# Patient Record
Sex: Female | Born: 1995 | Race: White | Hispanic: No | Marital: Single | Smoking: Current some day smoker
Health system: Southern US, Community
[De-identification: ages and names within clinical notes are randomized; demographics above are authoritative.]

## PROBLEM LIST (undated history)

## (undated) DIAGNOSIS — G43909 Migraine, unspecified, not intractable, without status migrainosus: Secondary | ICD-10-CM

## (undated) DIAGNOSIS — J45909 Unspecified asthma, uncomplicated: Secondary | ICD-10-CM

## (undated) DIAGNOSIS — B019 Varicella without complication: Secondary | ICD-10-CM

## (undated) DIAGNOSIS — T7840XA Allergy, unspecified, initial encounter: Secondary | ICD-10-CM

## (undated) HISTORY — DX: Unspecified asthma, uncomplicated: J45.909

## (undated) HISTORY — PX: NO PAST SURGERIES: SHX2092

## (undated) HISTORY — DX: Varicella without complication: B01.9

## (undated) HISTORY — DX: Allergy, unspecified, initial encounter: T78.40XA

---
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
## 2013-08-08 ENCOUNTER — Emergency Department: Payer: Self-pay | Admitting: Emergency Medicine

## 2013-08-09 LAB — URINALYSIS, COMPLETE
Bilirubin,UR: NEGATIVE
Glucose,UR: NEGATIVE mg/dL (ref 0–75)
Nitrite: NEGATIVE
Protein: 100
Specific Gravity: 1.028 (ref 1.003–1.030)
WBC UR: 4 /HPF (ref 0–5)

## 2014-12-31 ENCOUNTER — Emergency Department: Payer: Self-pay | Admitting: Emergency Medicine

## 2016-12-05 ENCOUNTER — Encounter: Payer: Self-pay | Admitting: Obstetrics and Gynecology

## 2017-04-22 ENCOUNTER — Encounter: Payer: Self-pay | Admitting: *Deleted

## 2017-04-22 ENCOUNTER — Emergency Department
Admission: EM | Admit: 2017-04-22 | Discharge: 2017-04-23 | Disposition: A | Discharge status: Home / Self Care | Payer: BLUE CROSS/BLUE SHIELD | Attending: Emergency Medicine | Admitting: Emergency Medicine

## 2017-04-22 ENCOUNTER — Emergency Department: Payer: BLUE CROSS/BLUE SHIELD

## 2017-04-22 DIAGNOSIS — J40 Bronchitis, not specified as acute or chronic: Secondary | ICD-10-CM | POA: Insufficient documentation

## 2017-04-22 DIAGNOSIS — R0602 Shortness of breath: Secondary | ICD-10-CM | POA: Diagnosis present

## 2017-04-22 DIAGNOSIS — F17200 Nicotine dependence, unspecified, uncomplicated: Secondary | ICD-10-CM | POA: Insufficient documentation

## 2017-04-22 MED ORDER — ALBUTEROL SULFATE (2.5 MG/3ML) 0.083% IN NEBU
INHALATION_SOLUTION | RESPIRATORY_TRACT | Status: AC
  Filled 2017-04-22: qty 6

## 2017-04-22 MED ORDER — ALBUTEROL SULFATE (2.5 MG/3ML) 0.083% IN NEBU
5.0000 mg | INHALATION_SOLUTION | Freq: Once | RESPIRATORY_TRACT | Status: AC
  Administered 2017-04-22: 5 mg via RESPIRATORY_TRACT

## 2017-04-22 MED ORDER — PREDNISONE 20 MG PO TABS
60.0000 mg | ORAL_TABLET | Freq: Once | ORAL | Status: AC
  Administered 2017-04-22: 60 mg via ORAL
  Filled 2017-04-22: qty 3

## 2017-04-22 MED ORDER — IPRATROPIUM-ALBUTEROL 0.5-2.5 (3) MG/3ML IN SOLN
3.0000 mL | Freq: Once | RESPIRATORY_TRACT | Status: AC
  Administered 2017-04-23: 3 mL via RESPIRATORY_TRACT
  Filled 2017-04-22: qty 3

## 2017-04-22 NOTE — ED Triage Notes (Signed)
Pt reports wheezing, shortness of breath, cough, sore throat, headache, sneezing, nasal congestion x 1 weeks. c/o upper/mid back pain. Denies fevers. Pt took mucinex and theraflu today.

## 2017-04-22 NOTE — ED Provider Notes (Signed)
[facility] Emergency Department Provider Note   ____________________________________________   First MD Initiated Contact with Patient 04/22/17 2321     (approximate)  I have reviewed the triage vital signs and the nursing notes.   HISTORY  Chief Complaint Shortness of Breath    HPI Vanessa Lindsey is a 21 y.o. female who presents to the ED from home with a chief complaint of nonproductive cough, wheezing, shortness of breath, sore throat, headache, sneezing and nasal congestion. Reports symptoms for one week. Symptoms associated with chest wall and back pain. Denies associated fever, chills, abdominal pain, nausea, vomiting, diarrhea. Denies recent travel or trauma. Patient tried Mucinex and TheraFlu today without relief of symptoms.   Past medical history None  There are no active problems to display for this patient.   History reviewed. No pertinent surgical history.  Prior to Admission medications   Medication Sig Start Date End Date Taking? Authorizing Provider  albuterol (PROVENTIL HFA;VENTOLIN HFA) 108 (90 Base) MCG/ACT inhaler Inhale 2 puffs into the lungs every 4 (four) hours as needed for wheezing or shortness of breath. 04/23/17   Irean Hong, MD  predniSONE (DELTASONE) 20 MG tablet 3 tablets daily x 4 days 04/23/17   Irean Hong, MD    Allergies Sulfa antibiotics  No family history on file.  Social History Social History  Substance Use Topics  . Smoking status: Current Every Day Smoker  . Smokeless tobacco: Current User  . Alcohol use No    Review of Systems  Constitutional: No fever/chills. Eyes: No visual changes. ENT: Positive for congestion and sore throat. Cardiovascular: Positive for chest wall pain. Respiratory: Positive for dry cough, wheezing, and shortness of breath. Gastrointestinal: No abdominal pain.  No nausea, no vomiting.  No diarrhea.  No constipation. Genitourinary: Negative for  dysuria. Musculoskeletal: Positive for back pain. Skin: Negative for rash. Neurological: Negative for headaches, focal weakness or numbness.   ____________________________________________   PHYSICAL EXAM:  VITAL SIGNS: ED Triage Vitals [04/22/17 2128]  Enc Vitals Group     BP (!) 124/57     Pulse Rate (!) 105     Resp (!) 22     Temp 99 F (37.2 C)     Temp Source Oral     SpO2 98 %     Weight 185 lb (83.9 kg)     Height  (1.575 m)     Head Circumference      Peak Flow      Pain Score      Pain Loc      Pain Edu?      Excl. in GC?      Constitutional: Alert and oriented. Well appearing and in no acute distress. Eyes: Conjunctivae are normal. PERRL. EOMI. Head: Atraumatic. Nose: Congestion/rhinnorhea. Mouth/Throat: Mucous membranes are moist.  Oropharynx slightly erythematous. Neck: No stridor.  Supple neck without meningismus. Hematological/Lymphatic/Immunilogical: No cervical lymphadenopathy. Cardiovascular: Normal rate, regular rhythm. Grossly normal heart sounds.  Good peripheral circulation. Respiratory: Normal respiratory effort.  No retractions. Lungs with scattered wheezing. Gastrointestinal: Soft and nontender. No distention. No abdominal bruits. No CVA tenderness. Musculoskeletal: No lower extremity tenderness nor edema.  No joint effusions. Neurologic:  Normal speech and language. No gross focal neurologic deficits are appreciated. No gait instability. Skin:  Skin is warm, dry and intact. No rash noted. No petechiae. Psychiatric: Mood and affect are normal. Speech and behavior are normal.  ____________________________________________   LABS (all labs ordered are listed, but  only abnormal results are displayed)  Labs Reviewed - No data to display ____________________________________________  EKG  None ____________________________________________  RADIOLOGY  Chest 2 view interpreted per Dr. Gwenyth Bender: No active cardiopulmonary  disease. ____________________________________________   PROCEDURES  Procedure(s) performed: None  Procedures  Critical Care performed: No  ____________________________________________   INITIAL IMPRESSION / ASSESSMENT AND PLAN / ED COURSE  Pertinent labs & imaging results that were available during my care of the patient were reviewed by me and considered in my medical decision making (see chart for details).  21 year old female who presents with bronchitis with wheezing. Chest x-ray negative for pneumonia. Will administer DuoNeb, initiate prednisone and reassess. Low suspicion for ACS, PE, dissection.  Clinical Course as of Apr 24 131  Tue Apr 23, 2017  0100 Reexamined after DuoNeb and prednisone: No wheezing. Room air saturations 98%. Patient feels significantly better. Will discharge home with prescriptions for prednisone, albuterol inhaler and Tussionex as needed. Strict return precautions given. Patient verbalizes understanding and agrees with plan of care.  [JS]    Clinical Course User Index [JS] Irean Hong, MD     ____________________________________________   FINAL CLINICAL IMPRESSION(S) / ED DIAGNOSES  Final diagnoses:  Bronchitis      NEW MEDICATIONS STARTED DURING THIS VISIT:  New Prescriptions   ALBUTEROL (PROVENTIL HFA;VENTOLIN HFA) 108 (90 BASE) MCG/ACT INHALER    Inhale 2 puffs into the lungs every 4 (four) hours as needed for wheezing or shortness of breath.   PREDNISONE (DELTASONE) 20 MG TABLET    3 tablets daily x 4 days     Note:  This document was prepared using Dragon voice recognition software and may include unintentional dictation errors.    Irean Hong, MD 04/23/17 [phone]

## 2017-04-23 MED ORDER — HYDROCOD POLST-CPM POLST ER 10-8 MG/5ML PO SUER: 5.0000 mL | Freq: Two times a day (BID) | ORAL | 0 refills | Status: DC

## 2017-04-23 MED ORDER — PREDNISONE 20 MG PO TABS: ORAL_TABLET | ORAL | 0 refills | Status: DC

## 2017-04-23 MED ORDER — ALBUTEROL SULFATE HFA 108 (90 BASE) MCG/ACT IN AERS: 2.0000 | INHALATION_SPRAY | RESPIRATORY_TRACT | 0 refills | Status: DC | PRN

## 2017-04-23 NOTE — Discharge Instructions (Addendum)
1. Finish steroid as prescribed (prednisone 60 mg daily 4 days). 2. Use albuterol inhaler 2 puffs every 4 hours as needed for wheezing. 3. Take cough medicine as needed (Tussionex). 4. Return to the ER for worsening symptoms, persistent vomiting, difficulty breathing or other concerns.

## 2017-09-05 ENCOUNTER — Encounter (INDEPENDENT_AMBULATORY_CARE_PROVIDER_SITE_OTHER): Payer: Self-pay

## 2017-09-05 ENCOUNTER — Other Ambulatory Visit (INDEPENDENT_AMBULATORY_CARE_PROVIDER_SITE_OTHER): Payer: BLUE CROSS/BLUE SHIELD

## 2017-09-05 ENCOUNTER — Encounter: Payer: Self-pay | Admitting: Primary Care

## 2017-09-05 ENCOUNTER — Ambulatory Visit (INDEPENDENT_AMBULATORY_CARE_PROVIDER_SITE_OTHER): Payer: BLUE CROSS/BLUE SHIELD | Admitting: Primary Care

## 2017-09-05 VITALS — BP 122/80 | HR 82 | Temp 98.1°F | Ht 61.0 in | Wt 187.8 lb

## 2017-09-05 DIAGNOSIS — N926 Irregular menstruation, unspecified: Secondary | ICD-10-CM

## 2017-09-05 DIAGNOSIS — E669 Obesity, unspecified: Secondary | ICD-10-CM

## 2017-09-05 LAB — COMPREHENSIVE METABOLIC PANEL
ALT: 7 U/L (ref 0–35)
AST: 10 U/L (ref 0–37)
Albumin: 4.3 g/dL (ref 3.5–5.2)
Alkaline Phosphatase: 88 U/L (ref 39–117)
BUN: 12 mg/dL (ref 6–23)
CALCIUM: 9.3 mg/dL (ref 8.4–10.5)
CHLORIDE: 107 meq/L (ref 96–112)
CO2: 26 meq/L (ref 19–32)
Creatinine, Ser: 0.71 mg/dL (ref 0.40–1.20)
GFR: 107.85 mL/min (ref >60.00)
Glucose, Bld: 96 mg/dL (ref 70–99)
Potassium: 4.2 mEq/L (ref 3.5–5.1)
Sodium: 139 mEq/L (ref 135–145)
Total Bilirubin: 0.6 mg/dL (ref 0.2–1.2)
Total Protein: 7 g/dL (ref 6.0–8.3)

## 2017-09-05 LAB — LIPID PANEL
CHOL/HDL RATIO: 3
Cholesterol: 100 mg/dL (ref 0–200)
HDL: 30.2 mg/dL — ABNORMAL LOW (ref >39.00)
LDL CALC: 51 mg/dL (ref 0–99)
NonHDL: 70.28
TRIGLYCERIDES: 96 mg/dL (ref 0.0–149.0)
VLDL: 19.2 mg/dL (ref 0.0–40.0)

## 2017-09-05 LAB — HEMOGLOBIN A1C: Hgb A1c MFr Bld: 5.5 % (ref 4.6–6.5)

## 2017-09-05 LAB — TSH: TSH: 1.61 u[IU]/mL (ref 0.35–4.50)

## 2017-09-05 MED ORDER — ETONOGESTREL-ETHINYL ESTRADIOL 0.12-0.015 MG/24HR VA RING: VAGINAL_RING | VAGINAL | 3 refills | Status: DC

## 2017-09-05 NOTE — Assessment & Plan Note (Signed)
Long discussion today about her diet and importance of regular exercise. Discussed BMI goal of less than 25.

## 2017-09-05 NOTE — Patient Instructions (Signed)
Complete lab work prior to leaving today. I will notify you of your results once received.   Start [employer]. Insert on the Sunday after your menstrual cycle begins.   For example, if you start your period on Monday, then insert the Nuva Ring that following Sunday. If you start your period on Sunday, then insert your Nuva Ring that same day.  Pull out the Nuva Ring at the end of the third week to allow for a menstrual cycle.   It may take three to six months for regulation of your cycle.   It was a pleasure to meet you today! Please don't hesitate to call me with any questions. Welcome to [facility]!

## 2017-09-05 NOTE — Assessment & Plan Note (Addendum)
Could be secondary to PCOS. We'll check A1c, CMP, TSH today. Strongly recommended weight loss through healthy diet and regular exercise. Discussed diet today, she seems to be on the right track.  Discussed different options for birth control, she would like to try NuvaRing. Discussed risk of smoking and birth control, she is working to cut back and will try over-the-counter products. Urine pregnancy negative today. Discussed start date of NuvaRing and directions for insertion. Discussed that it may take 3-6 months for periods to regulate, she verbalized understanding.

## 2017-09-05 NOTE — Progress Notes (Signed)
Subjective:    Patient ID: Vanessa Lindsey, female    DOB: [birth year], 21 y.o.   MRN: [id number]  HPI  Vanessa Lindsey is a 21 year old female who presents today to establish care and discuss the problems mentioned below. Will obtain old records.  1) Menstrual Problem: Menarche at age 21. She experienced regular periods until mid high school then started to notice cycles with spotting, missing periods, irregular periods, menorrhagia. Her cycles did regulate for a while but over the past 6 months she's noticed irregularity with spotting, long lasting periods, and then sometimes will skip periods. LMP was 08/19/17.  She has a family history of PCOS in her mother. She's also noticed dark hair growth to her buttocks, neck line.   2) Obesity: She's working on weight loss through diet changes and exercise.   Diet currently consists of:  Breakfast: None Lunch: Protein, vegetables, starch Dinner: Protein, vegetable, starch Snacks: Fruit, granola bar Desserts: Occasionally Beverages: Sweet tea, water, little soda and juice  Exercise: She is not currently exercising   3) Asthma: Overall mild. Typically has flares during seasonal changes. She has an albuterol inhaler for which she uses during seasonal changes. She'll also take benadryl PRN.   Review of Systems  Constitutional: Negative for fatigue.  Respiratory: Negative for shortness of breath.   Cardiovascular: Negative for chest pain.  Genitourinary: Positive for menstrual problem.  Skin:       Dark hair growth to buttocks, extremities, neck  Allergic/Immunologic: Positive for environmental allergies.       Past Medical History:  Diagnosis Date  . Allergy   . Asthma   . Chickenpox      Social History   Social History  . Marital status: Single    Spouse name: N/A  . Number of children: N/A  . Years of education: N/A   Occupational History  . Not on file.   Social History Main Topics  . Smoking status: Current Every  Day Smoker  . Smokeless tobacco: Never Used  . Alcohol use Yes  . Drug use: No  . Sexual activity: Not on file   Other Topics Concern  . Not on file   Social History Narrative   Single.   No children.   Works at [employer].   Enjoys shopping, spending time with friends.    No past surgical history on file.  Family History  Problem Relation Age of Onset  . Hypothyroidism Mother   . Heart attack Father   . Diabetes Father   . Hyperlipidemia Father   . Cancer Maternal Grandmother        Unsure    Allergies  Allergen Reactions  . Sulfa Antibiotics Hives    No current outpatient prescriptions on file prior to visit.   No current facility-administered medications on file prior to visit.     BP 122/80   Pulse 82   Temp 98.1 F (36.7 C) (Oral)   Ht 5\' 1"  (1.549 m)   Wt 187 lb 12.8 oz (85.2 kg)   LMP 08/25/2017 (Within Days)   SpO2 97%   BMI 35.48 kg/m    Objective:   Physical Exam  Constitutional: She appears well-nourished.  Neck: Neck supple.  Cardiovascular: Normal rate and regular rhythm.   Pulmonary/Chest: Effort normal and breath sounds normal.  Skin: Skin is warm and dry.  No hair growth noted to neck line    Psychiatric: She has a normal mood and affect.  Assessment & Plan:

## 2018-12-31 NOTE — L&D Delivery Note (Signed)
Delivery Note At 1520  a viable and healthy female "Birdie Riddle" was delivered via  (Presentation: OA ;  ).  APGAR: 8,9 ; weight pending skin to skin  .   Placenta status: battledore delivered intact with 3 vessel  Cord:  with the following complications: [place] x1 reduced on perineum  Anesthesia:  none Episiotomy:  none Lacerations:  none Suture Repair: NA Est. Blood Loss (mL):  150  Mom to postpartum.  Baby to Couplet care / Skin to Skin.  Ninel Abdella N Cynde Menard 10/13/2019, 3:35 PM

## 2019-04-27 ENCOUNTER — Ambulatory Visit (INDEPENDENT_AMBULATORY_CARE_PROVIDER_SITE_OTHER): Payer: BLUE CROSS/BLUE SHIELD | Admitting: Certified Nurse Midwife

## 2019-04-27 ENCOUNTER — Other Ambulatory Visit: Payer: Self-pay

## 2019-04-27 ENCOUNTER — Other Ambulatory Visit (HOSPITAL_COMMUNITY)
Admission: RE | Admit: 2019-04-27 | Discharge: 2019-04-27 | Disposition: A | Discharge status: Home / Self Care | Payer: BLUE CROSS/BLUE SHIELD | Source: Ambulatory Visit | Attending: Certified Nurse Midwife | Admitting: Certified Nurse Midwife

## 2019-04-27 ENCOUNTER — Encounter: Payer: Self-pay | Admitting: Certified Nurse Midwife

## 2019-04-27 VITALS — BP 103/74 | HR 101 | Wt 209.5 lb

## 2019-04-27 DIAGNOSIS — Z3402 Encounter for supervision of normal first pregnancy, second trimester: Secondary | ICD-10-CM | POA: Insufficient documentation

## 2019-04-27 LAB — OB RESULTS CONSOLE VARICELLA ZOSTER ANTIBODY, IGG: Varicella: IMMUNE

## 2019-04-27 MED ORDER — VITAFOL GUMMIES 3.33-0.333-34.8 MG PO CHEW: 1.0000 | CHEWABLE_TABLET | Freq: Every day | ORAL | 4 refills | Status: DC

## 2019-04-27 NOTE — Progress Notes (Signed)
NEW OB HISTORY AND PHYSICAL  SUBJECTIVE:       Vanessa Lindsey is a 23 y.o. No obstetric history on file. female, Patient's last menstrual period was 01/13/2019 (exact date)., Estimated Date of Delivery:10/20/19 presents today for establishment of Prenatal Care. She has no unusual complaints.    Gynecologic History Patient's last menstrual period was 01/13/2019 (exact date). Normal Contraception: none Last Pap: 2016. Results were: normal  Obstetric History OB History  Gravida Para Term Preterm AB Living  1            SAB TAB Ectopic Multiple Live Births               # Outcome Date GA Lbr Len/2nd Weight Sex Delivery Anes PTL Lv  1 Current             Past Medical History:  Diagnosis Date  . Allergy   . Asthma   . Chickenpox     History reviewed. No pertinent surgical history.  Current Outpatient Medications on File Prior to Visit  Medication Sig Dispense Refill  . albuterol (PROVENTIL HFA;VENTOLIN HFA) 108 (90 Base) MCG/ACT inhaler Inhale into the lungs every 6 (six) hours as needed.    . Prenatal Vit-Fe Fumarate-FA (PRENATAL MULTIVITAMIN) TABS tablet Take 1 tablet by mouth daily at 12 noon.     No current facility-administered medications on file prior to visit.     Allergies  Allergen Reactions  . Sulfa Antibiotics Hives    Social History   Socioeconomic History  . Marital status: Single    Spouse name: Not on file  . Number of children: Not on file  . Years of education: Not on file  . Highest education level: Not on file  Occupational History  . Not on file  Social Needs  . Financial resource strain: Not on file  . Food insecurity:    Worry: Not on file    Inability: Not on file  . Transportation needs:    Medical: Not on file    Non-medical: Not on file  Tobacco Use  . Smoking status: Current Every Day Smoker  . Smokeless tobacco: Never Used  Substance and Sexual Activity  . Alcohol use: Not Currently  . Drug use: No  . Sexual activity: Yes     Birth control/protection: None  Lifestyle  . Physical activity:    Days per week: Not on file    Minutes per session: Not on file  . Stress: Not on file  Relationships  . Social connections:    Talks on phone: Not on file    Gets together: Not on file    Attends religious service: Not on file    Active member of club or organization: Not on file    Attends meetings of clubs or organizations: Not on file    Relationship status: Not on file  . Intimate partner violence:    Fear of current or ex partner: Not on file    Emotionally abused: Not on file    Physically abused: Not on file    Forced sexual activity: Not on file  Other Topics Concern  . Not on file  Social History Narrative   Single.   No children.   Works at [employer].   Enjoys shopping, spending time with friends.    Family History  Problem Relation Age of Onset  . Hypothyroidism Mother   . Heart attack Father   . Diabetes Father   . Hyperlipidemia Father   .  Cancer Maternal Grandmother        Unsure    The following portions of the patient's history were reviewed and updated as appropriate: allergies, current medications, past OB history, past medical history, past surgical history, past family history, past social history, and problem list.    OBJECTIVE: Initial Physical Exam (New OB)  GENERAL APPEARANCE: alert, well appearing, in no apparent distress, oriented to person, place and time, obese HEAD: normocephalic, atraumatic MOUTH: mucous membranes moist, pharynx normal without lesions THYROID: no thyromegaly or masses present BREASTS: no masses noted, no significant tenderness, no palpable axillary nodes, no skin changes LUNGS: clear to auscultation, no wheezes, rales or rhonchi, symmetric air entry HEART: regular rate and rhythm, no murmurs ABDOMEN: soft, nontender, nondistended, no abnormal masses, no epigastric pain, obese, fundus not palpable and fundus soft, nontender 154 cm EXTREMITIES: no  redness or tenderness in the calves or thighs, no edema, no limitation in range of motion, intact peripheral pulses SKIN: normal coloration and turgor, no rashes LYMPH NODES: no adenopathy palpable NEUROLOGIC: alert, oriented, normal speech, no focal findings or movement disorder noted  PELVIC EXAM EXTERNAL GENITALIA: normal appearing vulva with no masses, tenderness or lesions VAGINA: no abnormal discharge or lesions CERVIX: no lesions or cervical motion tenderness UTERUS: gravid ADNEXA: no masses palpable and nontender OB EXAM PELVIMETRY: appears adequate RECTUM: exam not indicated  ASSESSMENT: Normal pregnancy  PLAN: New OB counseling: The patient has been given an overview regarding routine prenatal care. MD or midwifery care. Pt request to see midwives. Recommendations regarding diet, weight gain, and exercise in pregnancy were given. Early glucose screen ordered @ 16 wks. Prenatal testing, optional genetic testing,carrier screening, and ultrasound use in pregnancy were reviewed.  Benefits of Breast Feeding were discussed. The patient is encouraged to consider nursing her baby post partum. Return for glucose labs 2 wks. ROB 6 wks for anatomy scan and rob with Melody .   Doreene BurkeAnnie Nalini Alcaraz, CNM

## 2019-04-27 NOTE — Patient Instructions (Signed)

## 2019-04-27 NOTE — Progress Notes (Signed)
Vanessa Lindsey presents for NOB physical. Pregnancy confirmation done 02/13/19 [facility]. LMP 01/13/19.  G1 .  P0   . Pregnancy education material explained and given. 0 cats in the home. NOB labs ordered. (TSH/HbgA1c due to Increased BMI. HIV labs and Drug screen were explained optional and she did not decline. Drug screen ordered. PNV encouraged. Genetic screening options discussed. Genetic testing: Ordered.  Pt may discuss with provider.   All questions answered.

## 2019-04-28 LAB — HEPATITIS B SURFACE ANTIGEN: Hepatitis B Surface Ag: NEGATIVE

## 2019-04-28 LAB — MONITOR DRUG PROFILE 14(MW)
Amphetamine Scrn, Ur: NEGATIVE ng/mL
BARBITURATE SCREEN URINE: NEGATIVE ng/mL
BENZODIAZEPINE SCREEN, URINE: NEGATIVE ng/mL
Buprenorphine, Urine: NEGATIVE ng/mL
CANNABINOIDS UR QL SCN: NEGATIVE ng/mL
Cocaine (Metab) Scrn, Ur: NEGATIVE ng/mL
Creatinine(Crt), U: 74.3 mg/dL (ref 20.0–300.0)
Fentanyl, Urine: NEGATIVE pg/mL
Meperidine Screen, Urine: NEGATIVE ng/mL
Methadone Screen, Urine: NEGATIVE ng/mL
OXYCODONE+OXYMORPHONE UR QL SCN: NEGATIVE ng/mL
Opiate Scrn, Ur: NEGATIVE ng/mL
Ph of Urine: 5.5 (ref 4.5–8.9)
Phencyclidine Qn, Ur: NEGATIVE ng/mL
Propoxyphene Scrn, Ur: NEGATIVE ng/mL
SPECIFIC GRAVITY: 1.012
Tramadol Screen, Urine: NEGATIVE ng/mL

## 2019-04-28 LAB — CBC WITH DIFFERENTIAL
Basophils Absolute: 0.1 10*3/uL (ref 0.0–0.2)
Basos: 1 %
EOS (ABSOLUTE): 0.2 10*3/uL (ref 0.0–0.4)
Eos: 1 %
Hematocrit: 35 % (ref 34.0–46.6)
Hemoglobin: 12.1 g/dL (ref 11.1–15.9)
Immature Grans (Abs): 0 10*3/uL (ref 0.0–0.1)
Immature Granulocytes: 0 %
Lymphocytes Absolute: 2.7 10*3/uL (ref 0.7–3.1)
Lymphs: 19 %
MCH: 28.7 pg (ref 26.6–33.0)
MCHC: 34.6 g/dL (ref 31.5–35.7)
MCV: 83 fL (ref 79–97)
Monocytes Absolute: 0.7 10*3/uL (ref 0.1–0.9)
Monocytes: 5 %
Neutrophils Absolute: 10.8 10*3/uL — ABNORMAL HIGH (ref 1.4–7.0)
Neutrophils: 74 %
RBC: 4.22 x10E6/uL (ref 3.77–5.28)
RDW: 13.3 % (ref 11.7–15.4)
WBC: 14.5 10*3/uL — ABNORMAL HIGH (ref 3.4–10.8)

## 2019-04-28 LAB — URINALYSIS, ROUTINE W REFLEX MICROSCOPIC
Bilirubin, UA: NEGATIVE
Glucose, UA: NEGATIVE
Ketones, UA: NEGATIVE
Leukocytes,UA: NEGATIVE
Nitrite, UA: NEGATIVE
Protein,UA: NEGATIVE
RBC, UA: NEGATIVE
Specific Gravity, UA: 1.012 (ref 1.005–1.030)
Urobilinogen, Ur: 0.2 mg/dL (ref 0.2–1.0)
pH, UA: 5.5 (ref 5.0–7.5)

## 2019-04-28 LAB — HEMOGLOBIN A1C
Est. average glucose Bld gHb Est-mCnc: 111 mg/dL
Hgb A1c MFr Bld: 5.5 % (ref 4.8–5.6)

## 2019-04-28 LAB — ABO AND RH: Rh Factor: POSITIVE

## 2019-04-28 LAB — VARICELLA ZOSTER ANTIBODY, IGG: Varicella zoster IgG: 1026 index (ref >165)

## 2019-04-28 LAB — ANTIBODY SCREEN: Antibody Screen: NEGATIVE

## 2019-04-28 LAB — RUBELLA SCREEN: Rubella Antibodies, IGG: 1.44 index (ref >0.99)

## 2019-04-28 LAB — HIV ANTIBODY (ROUTINE TESTING W REFLEX): HIV Screen 4th Generation wRfx: NONREACTIVE

## 2019-04-28 LAB — TSH: TSH: 2.45 u[IU]/mL (ref 0.450–4.500)

## 2019-04-28 LAB — RPR: RPR Ser Ql: NONREACTIVE

## 2019-04-29 ENCOUNTER — Telehealth: Payer: Self-pay

## 2019-04-29 LAB — GC/CHLAMYDIA PROBE AMP
Chlamydia trachomatis, NAA: NEGATIVE
Neisseria Gonorrhoeae by PCR: NEGATIVE

## 2019-04-29 LAB — CYTOLOGY - PAP: Diagnosis: NEGATIVE

## 2019-04-29 LAB — URINE CULTURE: Organism ID, Bacteria: NO GROWTH

## 2019-04-29 NOTE — Telephone Encounter (Signed)
Message left on voicemail for pt to return my call- neg pap results per AT.

## 2019-05-04 ENCOUNTER — Telehealth: Payer: Self-pay

## 2019-05-04 NOTE — Telephone Encounter (Signed)
Message left for pt to return call re: neg pap per AT.

## 2019-05-06 ENCOUNTER — Telehealth: Payer: Self-pay

## 2019-05-06 NOTE — Telephone Encounter (Signed)
Another voicemail message sent asking pt to return my call re: normal pap smear.

## 2019-05-11 ENCOUNTER — Other Ambulatory Visit: Payer: BLUE CROSS/BLUE SHIELD

## 2019-05-11 ENCOUNTER — Other Ambulatory Visit: Payer: Self-pay

## 2019-05-11 DIAGNOSIS — Z3402 Encounter for supervision of normal first pregnancy, second trimester: Secondary | ICD-10-CM

## 2019-05-12 LAB — GLUCOSE TOLERANCE, 1 HOUR: Glucose, 1Hr PP: 179 mg/dL (ref 65–199)

## 2019-05-13 ENCOUNTER — Other Ambulatory Visit: Payer: Self-pay | Admitting: Certified Nurse Midwife

## 2019-05-13 ENCOUNTER — Telehealth: Payer: Self-pay | Admitting: *Deleted

## 2019-05-13 ENCOUNTER — Telehealth: Payer: Self-pay | Admitting: Certified Nurse Midwife

## 2019-05-13 DIAGNOSIS — O99810 Abnormal glucose complicating pregnancy: Secondary | ICD-10-CM

## 2019-05-13 NOTE — Progress Notes (Signed)
Abnormal early glucose screen. 3 hr screen ordered. PT notified via my chart.   Doreene Burke, CNM

## 2019-05-13 NOTE — Telephone Encounter (Signed)
Coronavirus (COVID-19) Are you at risk?  Are you at risk for the Coronavirus (COVID-19)?  To be considered HIGH RISK for Coronavirus (COVID-19), you have to meet the following criteria:  . Traveled to [place], [place], [place], [place] or [place]; or in the [place] to [place], [place], [place], or [place]; and have fever, cough, and shortness of breath within the last 2 weeks of travel OR . Been in close contact with a person diagnosed with COVID-19 within the last 2 weeks and have fever, cough, and shortness of breath . IF YOU DO NOT MEET THESE CRITERIA, YOU ARE CONSIDERED LOW RISK FOR COVID-19.  What to do if you are HIGH RISK for COVID-19?  . If you are having a medical emergency, call 911. . Seek medical care right away. Before you go to a doctor's office, urgent care or emergency department, call ahead and tell them about your recent travel, contact with someone diagnosed with COVID-19, and your symptoms. You should receive instructions from your physician's office regarding next steps of care.  . When you arrive at healthcare provider, tell the healthcare staff immediately you have returned from visiting [place], [place], [place], [place] or [place]; or traveled in the [place] to [place], [place], [place], or [place]; in the last two weeks or you have been in close contact with a person diagnosed with COVID-19 in the last 2 weeks.   . Tell the health care staff about your symptoms: fever, cough and shortness of breath. . After you have been seen by a medical provider, you will be either: o Tested for (COVID-19) and discharged home on quarantine except to seek medical care if symptoms worsen, and asked to  - Stay home and avoid contact with others until you get your results (4-5 days)  - Avoid travel on public transportation if possible (such as bus, train, or airplane) or o Sent to the Emergency Department by EMS for evaluation, COVID-19 testing, and possible  admission depending on your condition and test results.  What to do if you are LOW RISK for COVID-19?  Reduce your risk of any infection by using the same precautions used for avoiding the common cold or flu:  . Wash your hands often with soap and warm water for at least 20 seconds.  If soap and water are not readily available, use an alcohol-based hand sanitizer with at least 60% alcohol.  . If coughing or sneezing, cover your mouth and nose by coughing or sneezing into the elbow areas of your shirt or coat, into a tissue or into your sleeve (not your hands). . Avoid shaking hands with others and consider head nods or verbal greetings only. . Avoid touching your eyes, nose, or mouth with unwashed hands.  . Avoid close contact with people who are sick. . Avoid places or events with large numbers of people in one location, like concerts or sporting events. . Carefully consider travel plans you have or are making. . If you are planning any travel outside or inside the [place], visit the CDC's Travelers' Health webpage for the latest health notices. . If you have some symptoms but not all symptoms, continue to monitor at home and seek medical attention if your symptoms worsen. . If you are having a medical emergency, call 911.   ADDITIONAL HEALTHCARE OPTIONS FOR PATIENTS  [facility] / e-Visit: [email or web address]         [facility]: [phone]  [facility]: [phone]                   [facility]: [phone]   Spoke with pt denies any sx.   , CMA 

## 2019-05-13 NOTE — Telephone Encounter (Signed)
The patient called and stated that she needs to speak with a nurse to ask a few questions about her glucose screening results. Please advise.

## 2019-05-13 NOTE — Telephone Encounter (Signed)
Spoke with pt we discussed her 3 hr GTT

## 2019-05-14 ENCOUNTER — Other Ambulatory Visit: Payer: Self-pay

## 2019-05-14 ENCOUNTER — Other Ambulatory Visit: Payer: BLUE CROSS/BLUE SHIELD

## 2019-05-14 DIAGNOSIS — O99810 Abnormal glucose complicating pregnancy: Secondary | ICD-10-CM

## 2019-05-15 LAB — GESTATIONAL GLUCOSE TOLERANCE
Glucose, Fasting: 86 mg/dL (ref 65–94)
Glucose, GTT - 1 Hour: 152 mg/dL (ref 65–179)
Glucose, GTT - 2 Hour: 114 mg/dL (ref 65–154)
Glucose, GTT - 3 Hour: 68 mg/dL (ref 65–139)

## 2019-06-09 ENCOUNTER — Encounter: Payer: BLUE CROSS/BLUE SHIELD | Admitting: Obstetrics and Gynecology

## 2019-06-09 ENCOUNTER — Ambulatory Visit (INDEPENDENT_AMBULATORY_CARE_PROVIDER_SITE_OTHER): Payer: BC Managed Care – PPO | Admitting: Certified Nurse Midwife

## 2019-06-09 ENCOUNTER — Other Ambulatory Visit: Payer: Self-pay

## 2019-06-09 ENCOUNTER — Ambulatory Visit (INDEPENDENT_AMBULATORY_CARE_PROVIDER_SITE_OTHER): Payer: BC Managed Care – PPO

## 2019-06-09 VITALS — BP 100/69 | HR 103 | Wt 212.7 lb

## 2019-06-09 DIAGNOSIS — Z3A21 21 weeks gestation of pregnancy: Secondary | ICD-10-CM

## 2019-06-09 DIAGNOSIS — Z3689 Encounter for other specified antenatal screening: Secondary | ICD-10-CM

## 2019-06-09 DIAGNOSIS — Z3402 Encounter for supervision of normal first pregnancy, second trimester: Secondary | ICD-10-CM

## 2019-06-09 DIAGNOSIS — Z3492 Encounter for supervision of normal pregnancy, unspecified, second trimester: Secondary | ICD-10-CM

## 2019-06-09 DIAGNOSIS — Z363 Encounter for antenatal screening for malformations: Secondary | ICD-10-CM | POA: Diagnosis not present

## 2019-06-09 LAB — POCT URINALYSIS DIPSTICK OB
Bilirubin, UA: NEGATIVE
Blood, UA: NEGATIVE
Glucose, UA: NEGATIVE
Ketones, UA: NEGATIVE
Leukocytes, UA: NEGATIVE
Nitrite, UA: NEGATIVE
POC,PROTEIN,UA: NEGATIVE
Spec Grav, UA: 1.015 (ref 1.010–1.025)
Urobilinogen, UA: 0.2 E.U./dL
pH, UA: 6 (ref 5.0–8.0)

## 2019-06-09 MED ORDER — AMOXICILLIN 500 MG PO CAPS: 500.0000 mg | ORAL_CAPSULE | Freq: Three times a day (TID) | ORAL | 0 refills | Status: DC

## 2019-06-09 MED ORDER — TERCONAZOLE 0.4 % VA CREA: 1.0000 | TOPICAL_CREAM | Freq: Every day | VAGINAL | 0 refills | Status: DC

## 2019-06-09 NOTE — Progress Notes (Signed)
ROB-Reports right ear pain x 1 week. Tympanic membrane red and bulging upon inspection. Rx: Amoxicillin and Terazole, see orders.  Anatomy scan today incomplete, see below. Discussed weight gain in pregnancy and encouraged twice daily walking. Anticipatory guidance regarding course of prenatal care. Has already contacted [facility]. Reviewed red flag symptoms and when to call. RTC x 3-4 weeks for follow up anatomy scan and ROB or sooner if needed.    ULTRASOUND REPORT  Location: [facility] Date of Service: 06/09/2019   Indications:Anatomy Ultrasound Findings:  Singleton intrauterine pregnancy is visualized with FHR at 152 BPM. Biometrics give an (U/S) Gestational age of [redacted]w[redacted]d and an (U/S) EDD of 10/22/2019; this correlates with the clinically established Estimated Date of Delivery: 10/20/19  Fetal presentation is Cephalic.  EFW: 373 g ( 13 oz ). Placenta: posterior. Grade: 1 AFI: subjectively normal.  Anatomic survey is incomplete for nose/lips , 4 ch heart and normal; Gender - female.    Right Ovary is normal in appearance. Left Ovary is normal appearance. Survey of the adnexa demonstrates no adnexal masses. There is no free peritoneal fluid in the cul de sac.  Impression: 1. [redacted]w[redacted]d Viable Singleton Intrauterine pregnancy by U/S. 2. (U/S) EDD is consistent with Clinically established Estimated Date of Delivery: 10/20/19 . 3. Anatomy Scan incomplete for nose/lips , 4 ch heart.  Recommendations: 1.Clinical correlation with the patient's History and Physical Exam.

## 2019-06-09 NOTE — Progress Notes (Signed)
ROB- anatomy scan done today, pt is having pain R ear x 1 week

## 2019-06-09 NOTE — Patient Instructions (Addendum)
Back Pain in Pregnancy Back pain during pregnancy is common. Back pain may be caused by several factors that are related to changes during your pregnancy. Follow these instructions at home: Managing pain, stiffness, and swelling      If directed, for sudden (acute) back pain, put ice on the painful area. ? Put ice in a plastic bag. ? Place a towel between your skin and the bag. ? Leave the ice on for 20 minutes, 2-3 times per day.  If directed, apply heat to the affected area before you exercise. Use the heat source that your health care provider recommends, such as a moist heat pack or a heating pad. ? Place a towel between your skin and the heat source. ? Leave the heat on for 20-30 minutes. ? Remove the heat if your skin turns bright red. This is especially important if you are unable to feel pain, heat, or cold. You may have a greater risk of getting burned.  If directed, massage the affected area. Activity  Exercise as told by your health care provider. Gentle exercise is the best way to prevent or manage back pain.  Listen to your body when lifting. If lifting hurts, ask for help or bend your knees. This uses your leg muscles instead of your back muscles.  Squat down when picking up something from the floor. Do not bend over.  Only use bed rest for short periods as told by your health care provider. Bed rest should only be used for the most severe episodes of back pain. Standing, sitting, and lying down  Do not stand in one place for long periods of time.  Use good posture when sitting. Make sure your head rests over your shoulders and is not hanging forward. Use a pillow on your lower back if necessary.  Try sleeping on your side, preferably the left side, with a pregnancy support pillow or 1-2 regular pillows between your legs. ? If you have back pain after a night's rest, your bed may be too soft. ? A firm mattress may provide more support for your back during  pregnancy. General instructions  Do not wear high heels.  Eat a healthy diet. Try to gain weight within your health care provider's recommendations.  Use a maternity girdle, elastic sling, or back brace as told by your health care provider.  Take over-the-counter and prescription medicines only as told by your health care provider.  Work with a physical therapist or massage therapist to find ways to manage back pain. Acupuncture or massage therapy may be helpful.  Keep all follow-up visits as told by your health care provider. This is important. Contact a health care provider if:  Your back pain interferes with your daily activities.  You have increasing pain in other parts of your body. Get help right away if:  You develop numbness, tingling, weakness, or problems with the use of your arms or legs.  You develop severe back pain that is not controlled with medicine.  You have a change in bowel or bladder control.  You develop shortness of breath, dizziness, or you faint.  You develop nausea, vomiting, or sweating.  You have back pain that is a rhythmic, cramping pain similar to labor pains. Labor pain is usually 1-2 minutes apart, lasts for about 1 minute, and involves a bearing down feeling or pressure in your pelvis.  You have back pain and your water breaks or you have vaginal bleeding.  You have back pain or numbness  that travels down your leg.  Your back pain developed after you fell.  You develop pain on one side of your back.  You see blood in your urine.  You develop skin blisters in the area of your back pain. Summary  Back pain may be caused by several factors that are related to changes during your pregnancy.  Follow instructions as told by your health care provider for managing pain, stiffness, and swelling.  Exercise as told by your health care provider. Gentle exercise is the best way to prevent or manage back pain.  Take over-the-counter and  prescription medicines only as told by your health care provider.  Keep all follow-up visits as told by your health care provider. This is important. This information is not intended to replace advice given to you by your health care provider. Make sure you discuss any questions you have with your health care provider. Document Released: 03/27/2006 Document Revised: 06/04/2018 Document Reviewed: 06/04/2018 Elsevier Interactive Patient Education  2019 [place]. Round Ligament Pain  The round ligament is a cord of muscle and tissue that helps support the uterus. It can become a source of pain during pregnancy if it becomes stretched or twisted as the baby grows. The pain usually begins in the second trimester (13-28 weeks) of pregnancy, and it can come and go until the baby is delivered. It is not a serious problem, and it does not cause harm to the baby. Round ligament pain is usually a short, sharp, and pinching pain, but it can also be a dull, lingering, and aching pain. The pain is felt in the lower side of the abdomen or in the groin. It usually starts deep in the groin and moves up to the outside of the hip area. The pain may occur when you:  Suddenly change position, such as quickly going from a sitting to standing position.  Roll over in bed.  Cough or sneeze.  Do physical activity. Follow these instructions at home:   Watch your condition for any changes.  When the pain starts, relax. Then try any of these methods to help with the pain: ? Sitting down. ? Flexing your knees up to your abdomen. ? Lying on your side with one pillow under your abdomen and another pillow between your legs. ? Sitting in a warm bath for 15-20 minutes or until the pain goes away.  Take over-the-counter and prescription medicines only as told by your health care provider.  Move slowly when you sit down or stand up.  Avoid long walks if they cause pain.  Stop or reduce your physical activities if  they cause pain.  Keep all follow-up visits as told by your health care provider. This is important. Contact a health care provider if:  Your pain does not go away with treatment.  You feel pain in your back that you did not have before.  Your medicine is not helping. Get help right away if:  You have a fever or chills.  You develop uterine contractions.  You have vaginal bleeding.  You have nausea or vomiting.  You have diarrhea.  You have pain when you urinate. Summary  Round ligament pain is felt in the lower abdomen or groin. It is usually a short, sharp, and pinching pain. It can also be a dull, lingering, and aching pain.  This pain usually begins in the second trimester (13-28 weeks). It occurs because the uterus is stretching with the growing baby, and it is not harmful  to the baby.  You may notice the pain when you suddenly change position, when you cough or sneeze, or during physical activity.  Relaxing, flexing your knees to your abdomen, lying on one side, or taking a warm bath may help to get rid of the pain.  Get help from your health care provider if the pain does not go away or if you have vaginal bleeding, nausea, vomiting, diarrhea, or painful urination. This information is not intended to replace advice given to you by your health care provider. Make sure you discuss any questions you have with your health care provider. Document Released: 09/25/2008 Document Revised: 06/04/2018 Document Reviewed: 06/04/2018 Elsevier Interactive Patient Education  2019 [employer] Second Trimester of Pregnancy  The second trimester is from week 14 through week 27 (month 4 through 6). This is often the time in pregnancy that you feel your best. Often times, morning sickness has lessened or quit. You may have more energy, and you may get hungry more often. Your unborn baby is growing rapidly. At the end of the sixth month, he or she is about 9 inches long and weighs about 1  pounds. You will likely feel the baby move between 18 and 20 weeks of pregnancy. Follow these instructions at home: Medicines  Take over-the-counter and prescription medicines only as told by your doctor. Some medicines are safe and some medicines are not safe during pregnancy.  Take a prenatal vitamin that contains at least 600 micrograms (mcg) of folic acid.  If you have trouble pooping (constipation), take medicine that will make your stool soft (stool softener) if your doctor approves. Eating and drinking   Eat regular, healthy meals.  Avoid raw meat and uncooked cheese.  If you get low calcium from the food you eat, talk to your doctor about taking a daily calcium supplement.  Avoid foods that are high in fat and sugars, such as fried and sweet foods.  If you feel sick to your stomach (nauseous) or throw up (vomit): ? Eat 4 or 5 small meals a day instead of 3 large meals. ? Try eating a few soda crackers. ? Drink liquids between meals instead of during meals.  To prevent constipation: ? Eat foods that are high in fiber, like fresh fruits and vegetables, whole grains, and beans. ? Drink enough fluids to keep your pee (urine) clear or pale yellow. Activity  Exercise only as told by your doctor. Stop exercising if you start to have cramps.  Do not exercise if it is too hot, too humid, or if you are in a place of great height (high altitude).  Avoid heavy lifting.  Wear low-heeled shoes. Sit and stand up straight.  You can continue to have sex unless your doctor tells you not to. Relieving pain and discomfort  Wear a good support bra if your breasts are tender.  Take warm water baths (sitz baths) to soothe pain or discomfort caused by hemorrhoids. Use hemorrhoid cream if your doctor approves.  Rest with your legs raised if you have leg cramps or low back pain.  If you develop puffy, bulging veins (varicose veins) in your legs: ? Wear support hose or compression  stockings as told by your doctor. ? Raise (elevate) your feet for 15 minutes, 3-4 times a day. ? Limit salt in your food. Prenatal care  Write down your questions. Take them to your prenatal visits.  Keep all your prenatal visits as told by your doctor. This is important. Safety  Wear your seat belt when driving.  Make a list of emergency phone numbers, including numbers for family, friends, the hospital, and police and fire departments. General instructions  Ask your doctor about the right foods to eat or for help finding a counselor, if you need these services.  Ask your doctor about local prenatal classes. Begin classes before month 6 of your pregnancy.  Do not use hot tubs, steam rooms, or saunas.  Do not douche or use tampons or scented sanitary pads.  Do not cross your legs for long periods of time.  Visit your dentist if you have not done so. Use a soft toothbrush to brush your teeth. Floss gently.  Avoid all smoking, herbs, and alcohol. Avoid drugs that are not approved by your doctor.  Do not use any products that contain nicotine or tobacco, such as cigarettes and e-cigarettes. If you need help quitting, ask your doctor.  Avoid cat litter boxes and soil used by cats. These carry germs that can cause birth defects in the baby and can cause a loss of your baby (miscarriage) or stillbirth. Contact a doctor if:  You have mild cramps or pressure in your lower belly.  You have pain when you pee (urinate).  You have bad smelling fluid coming from your vagina.  You continue to feel sick to your stomach (nauseous), throw up (vomit), or have watery poop (diarrhea).  You have a nagging pain in your belly area.  You feel dizzy. Get help right away if:  You have a fever.  You are leaking fluid from your vagina.  You have spotting or bleeding from your vagina.  You have severe belly cramping or pain.  You lose or gain weight rapidly.  You have trouble catching  your breath and have chest pain.  You notice sudden or extreme puffiness (swelling) of your face, hands, ankles, feet, or legs.  You have not felt the baby move in over an hour.  You have severe headaches that do not go away when you take medicine.  You have trouble seeing. Summary  The second trimester is from week 14 through week 27 (months 4 through 6). This is often the time in pregnancy that you feel your best.  To take care of yourself and your unborn baby, you will need to eat healthy meals, take medicines only if your doctor tells you to do so, and do activities that are safe for you and your baby.  Call your doctor if you get sick or if you notice anything unusual about your pregnancy. Also, call your doctor if you need help with the right food to eat, or if you want to know what activities are safe for you. This information is not intended to replace advice given to you by your health care provider. Make sure you discuss any questions you have with your health care provider. Document Released: 03/13/2010 Document Revised: 01/22/2017 Document Reviewed: 01/22/2017 Elsevier Interactive Patient Education  2019 [place] for Pregnant Women While you are pregnant, your body requires additional nutrition to help support your growing baby. You also have a higher need for some vitamins and minerals, such as folic acid, calcium, iron, and vitamin D. Eating a healthy, well-balanced diet is very important for your health and your baby's health. Your need for extra calories varies for the three 48-month segments of your pregnancy (trimesters). For most women, it is recommended to consume:  150 extra calories a day during the first trimester.  300 extra calories a day during the second trimester.  300 extra calories a day during the third trimester. What are tips for following this plan?   Do not try to lose weight or go on a diet during pregnancy.  Limit your overall  intake of foods that have "empty calories." These are foods that have little nutritional value, such as sweets, desserts, candies, and sugar-sweetened beverages.  Eat a variety of foods (especially fruits and vegetables) to get a full range of vitamins and minerals.  Take a prenatal vitamin to help meet your additional vitamin and mineral needs during pregnancy, specifically for folic acid, iron, calcium, and vitamin D.  Remember to stay active. Ask your health care provider what types of exercise and activities are safe for you.  Practice good food safety and cleanliness. Wash your hands before you eat and after you prepare raw meat. Wash all fruits and vegetables well before peeling or eating. Taking these actions can help to prevent food-borne illnesses that can be very dangerous to your baby, such as listeriosis. Ask your health care provider for more information about listeriosis. What does 150 extra calories look like? Healthy options that provide 150 extra calories each day could be any of the following:  6-8 oz (170-230 g) of plain low-fat yogurt with  cup of berries.  1 apple with 2 teaspoons (11 g) of peanut butter.  Cut-up vegetables with  cup (60 g) of hummus.  8 oz (230 mL) or 1 cup of low-fat chocolate milk.  1 stick of string cheese with 1 medium orange.  1 peanut butter and jelly sandwich that is made with one slice of whole-wheat bread and 1 tsp (5 g) of peanut butter. For 300 extra calories, you could eat two of those healthy options each day. What is a healthy amount of weight to gain? The right amount of weight gain for you is based on your BMI before you became pregnant. If your BMI:  Was less than 18 (underweight), you should gain 28-40 lb (13-18 kg).  Was 18-24.9 (normal), you should gain 25-35 lb (11-16 kg).  Was 25-29.9 (overweight), you should gain 15-25 lb (7-11 kg).  Was 30 or greater (obese), you should gain 11-20 lb (5-9 kg). What if I am having twins  or multiples? Generally, if you are carrying twins or multiples:  You may need to eat 300-600 extra calories a day.  The recommended range for total weight gain is 25-54 lb (11-25 kg), depending on your BMI before pregnancy.  Talk with your health care provider to find out about nutritional needs, weight gain, and exercise that is right for you. What foods can I eat?  Grains All grains. Choose whole grains, such as whole-wheat bread, oatmeal, or brown rice. Vegetables All vegetables. Eat a variety of colors and types of vegetables. Remember to wash your vegetables well before peeling or eating. Fruits All fruits. Eat a variety of colors and types of fruit. Remember to wash your fruits well before peeling or eating. Meats and other protein foods Lean meats, including chicken, [place], fish, and lean cuts of beef, veal, or pork. If you eat fish or seafood, choose options that are higher in omega-3 fatty acids and lower in mercury, such as salmon, herring, mussels, trout, sardines, pollock, shrimp, crab, and lobster. Tofu. Tempeh. Beans. Eggs. Peanut butter and other nut butters. Make sure that all meats, poultry, and eggs are cooked to food-safe temperatures or "well-done." Two or more servings of fish  are recommended each week in order to get the most benefits from omega-3 fatty acids that are found in seafood. Choose fish that are lower in mercury. You can find more information online:  [employer] Dairy Pasteurized milk and milk alternatives (such as almond milk). Pasteurized yogurt and pasteurized cheese. Cottage cheese. Sour cream. Beverages Water. Juices that contain 100% fruit juice or vegetable juice. Caffeine-free teas and decaffeinated coffee. Drinks that contain caffeine are okay to drink, but it is better to avoid caffeine. Keep your total caffeine intake to less than 200 mg each day (which is 12 oz or 355 mL of coffee, tea, or soda) or the limit as told by your health care  provider. Fats and oils Fats and oils are okay to include in moderation. Sweets and desserts Sweets and desserts are okay to include in moderation. Seasoning and other foods All pasteurized condiments. The items listed above may not be a complete list of recommended foods and beverages. Contact your dietitian for more options. What foods are not recommended? Vegetables Raw (unpasteurized) vegetable juices. Fruits Unpasteurized fruit juices. Meats and other protein foods Lunch meats, bologna, hot dogs, or other deli meats. (If you must eat those meats, reheat them until they are steaming hot.) Refrigerated pat, meat spreads from a meat counter, smoked seafood that is found in the refrigerated section of a store. Raw or undercooked meats, poultry, and eggs. Raw fish, such as sushi or sashimi. Fish that have high mercury content, such as tilefish, shark, swordfish, and king mackerel. To learn more about mercury in fish, talk with your health care provider or look for online resources, such as:  [employer] Dairy Raw (unpasteurized) milk and any foods that have raw milk in them. Soft cheeses, such as feta, queso blanco, queso fresco, Brie, Camembert cheeses, blue-veined cheeses, and Panela cheese (unless it is made with pasteurized milk, which must be stated on the label). Beverages Alcohol. Sugar-sweetened beverages, such as sodas, teas, or energy drinks. Seasoning and other foods Homemade fermented foods and drinks, such as pickles, sauerkraut, or kombucha drinks. (Store-bought pasteurized versions of these are okay.) Salads that are made in a store or deli, such as ham salad, chicken salad, egg salad, tuna salad, and seafood salad. The items listed above may not be a complete list of foods and beverages to avoid. Contact your dietitian for more information. Where to find more information To calculate the number of calories you need based on your height, weight, and activity level, you can  use an online calculator such as:  MobileTransition.ch To calculate how much weight you should gain during pregnancy, you can use an online pregnancy weight gain calculator such as:  Streaming[employer].com.cy Summary  While you are pregnant, your body requires additional nutrition to help support your growing baby.  Eat a variety of foods, especially fruits and vegetables to get a full range of vitamins and minerals.  Practice good food safety and cleanliness. Wash your hands before you eat and after you prepare raw meat. Wash all fruits and vegetables well before peeling or eating. Taking these actions can help to prevent food-borne illnesses, such as listeriosis, that can be very dangerous to your baby.  Do not eat raw meat or fish. Do not eat fish that have high mercury content, such as tilefish, shark, swordfish, and king mackerel. Do not eat unpasteurized (raw) dairy.  Take a prenatal vitamin to help meet your additional vitamin and mineral needs during pregnancy, specifically for folic acid, iron, calcium, and vitamin  D. This information is not intended to replace advice given to you by your health care provider. Make sure you discuss any questions you have with your health care provider. Document Released: 10/01/2014 Document Revised: 09/13/2017 Document Reviewed: 09/13/2017 Elsevier Interactive Patient Education  2019 [employer].

## 2019-06-11 ENCOUNTER — Telehealth: Payer: Self-pay | Admitting: Obstetrics and Gynecology

## 2019-06-11 ENCOUNTER — Other Ambulatory Visit: Payer: Self-pay | Admitting: *Deleted

## 2019-06-11 MED ORDER — ALBUTEROL SULFATE HFA 108 (90 BASE) MCG/ACT IN AERS: 2.0000 | INHALATION_SPRAY | Freq: Four times a day (QID) | RESPIRATORY_TRACT | 6 refills | Status: DC | PRN

## 2019-06-11 NOTE — Telephone Encounter (Signed)
The patient called and stated that her prescription is not at her pharmacy, The patient stated that she needs to speak with her midwife asap because she really needs her medication. Please advise.

## 2019-06-11 NOTE — Telephone Encounter (Signed)
Pt called requesting orders to be placed for Covid-19 testing. Pt was in contact with mother in law who is covid-19 positive. Pt has several questions and concerns. Please advise.

## 2019-06-11 NOTE — Telephone Encounter (Signed)
?   Ok to put order in? 

## 2019-06-11 NOTE — Telephone Encounter (Signed)
Spoke with pt mother in laws test was negative, there was some confusion in conversations

## 2019-06-12 ENCOUNTER — Other Ambulatory Visit: Payer: Self-pay | Admitting: *Deleted

## 2019-06-12 ENCOUNTER — Telehealth: Payer: Self-pay | Admitting: Obstetrics and Gynecology

## 2019-06-12 DIAGNOSIS — Z20828 Contact with and (suspected) exposure to other viral communicable diseases: Secondary | ICD-10-CM

## 2019-06-12 DIAGNOSIS — Z20822 Contact with and (suspected) exposure to covid-19: Secondary | ICD-10-CM

## 2019-06-12 NOTE — Telephone Encounter (Signed)
Pt called stating her boyfriend tested positive for COVID 19, put order in she will go to testing site on 06/15/19, advised pt to quarantine

## 2019-06-12 NOTE — Telephone Encounter (Signed)
Yes OK to order 

## 2019-06-12 NOTE — Telephone Encounter (Signed)
The patient called and stated that her boyfriend is positive for covid-19, Spoke w/ Nurse. Amy picked up call and spoke with patient.

## 2019-06-15 DIAGNOSIS — Z20828 Contact with and (suspected) exposure to other viral communicable diseases: Secondary | ICD-10-CM | POA: Diagnosis not present

## 2019-06-17 LAB — NOVEL CORONAVIRUS, NAA: SARS-CoV-2, NAA: NOT DETECTED

## 2019-06-23 ENCOUNTER — Other Ambulatory Visit: Payer: Self-pay

## 2019-06-23 ENCOUNTER — Telehealth: Payer: Self-pay | Admitting: Certified Nurse Midwife

## 2019-06-23 MED ORDER — CIPRODEX 0.3-0.1 % OT SUSP: 4.0000 [drp] | Freq: Two times a day (BID) | OTIC | 0 refills | Status: DC

## 2019-06-23 NOTE — Telephone Encounter (Signed)
Rx Ciprodex. Thanks, JML

## 2019-06-23 NOTE — Telephone Encounter (Signed)
The patient called and stated that she is currently taking antibiotics for an infection and it is not helping. The patient is requesting ear drops to be sent to her pharmacy. The patient is requesting a call back. Please advise.

## 2019-06-23 NOTE — Telephone Encounter (Signed)
Called patient to make her aware prescription was sent to pharmacy.  Patient verbalized understanding.

## 2019-06-29 ENCOUNTER — Telehealth: Payer: Self-pay

## 2019-06-29 NOTE — Telephone Encounter (Signed)
Coronavirus (COVID-19) Are you at risk?  Are you at risk for the Coronavirus (COVID-19)?  To be considered HIGH RISK for Coronavirus (COVID-19), you have to meet the following criteria:  . Traveled to [place], [place], [place], [place] or [place]; or in the [place] to [place], [place], [place], or [place]; and have fever, cough, and shortness of breath within the last 2 weeks of travel OR . Been in close contact with a person diagnosed with COVID-19 within the last 2 weeks and have fever, cough, and shortness of breath . IF YOU DO NOT MEET THESE CRITERIA, YOU ARE CONSIDERED LOW RISK FOR COVID-19.  What to do if you are HIGH RISK for COVID-19?  . If you are having a medical emergency, call 911. . Seek medical care right away. Before you go to a doctor's office, urgent care or emergency department, call ahead and tell them about your recent travel, contact with someone diagnosed with COVID-19, and your symptoms. You should receive instructions from your physician's office regarding next steps of care.  . When you arrive at healthcare provider, tell the healthcare staff immediately you have returned from visiting [place], [place], [place], [place] or [place]; or traveled in the [place] to [place], [place], [place], or [place]; in the last two weeks or you have been in close contact with a person diagnosed with COVID-19 in the last 2 weeks.   . Tell the health care staff about your symptoms: fever, cough and shortness of breath. . After you have been seen by a medical provider, you will be either: o Tested for (COVID-19) and discharged home on quarantine except to seek medical care if symptoms worsen, and asked to  - Stay home and avoid contact with others until you get your results (4-5 days)  - Avoid travel on public transportation if possible (such as bus, train, or airplane) or o Sent to the Emergency Department by EMS for evaluation, COVID-19 testing, and possible  admission depending on your condition and test results.  What to do if you are LOW RISK for COVID-19?  Reduce your risk of any infection by using the same precautions used for avoiding the common cold or flu:  . Wash your hands often with soap and warm water for at least 20 seconds.  If soap and water are not readily available, use an alcohol-based hand sanitizer with at least 60% alcohol.  . If coughing or sneezing, cover your mouth and nose by coughing or sneezing into the elbow areas of your shirt or coat, into a tissue or into your sleeve (not your hands). . Avoid shaking hands with others and consider head nods or verbal greetings only. . Avoid touching your eyes, nose, or mouth with unwashed hands.  . Avoid close contact with people who are Brentin Shin. . Avoid places or events with large numbers of people in one location, like concerts or sporting events. . Carefully consider travel plans you have or are making. . If you are planning any travel outside or inside the [place], visit the CDC's Travelers' Health webpage for the latest health notices. . If you have some symptoms but not all symptoms, continue to monitor at home and seek medical attention if your symptoms worsen. . If you are having a medical emergency, call 911.  06/29/19 SCREENING NEG SLS ADDITIONAL HEALTHCARE OPTIONS FOR PATIENTS  [facility] / e-Visit: [email or web address]         [facility]: [phone]    [facility]: [phone]                   [facility]: [phone]  

## 2019-06-30 ENCOUNTER — Other Ambulatory Visit: Payer: Self-pay

## 2019-06-30 ENCOUNTER — Ambulatory Visit (INDEPENDENT_AMBULATORY_CARE_PROVIDER_SITE_OTHER): Payer: BC Managed Care – PPO

## 2019-06-30 ENCOUNTER — Ambulatory Visit (INDEPENDENT_AMBULATORY_CARE_PROVIDER_SITE_OTHER): Payer: BC Managed Care – PPO | Admitting: Certified Nurse Midwife

## 2019-06-30 ENCOUNTER — Encounter: Payer: Self-pay | Admitting: Certified Nurse Midwife

## 2019-06-30 VITALS — BP 96/62 | HR 98 | Wt 215.0 lb

## 2019-06-30 DIAGNOSIS — Z3A24 24 weeks gestation of pregnancy: Secondary | ICD-10-CM

## 2019-06-30 DIAGNOSIS — Z3492 Encounter for supervision of normal pregnancy, unspecified, second trimester: Secondary | ICD-10-CM

## 2019-06-30 DIAGNOSIS — Z3689 Encounter for other specified antenatal screening: Secondary | ICD-10-CM | POA: Diagnosis not present

## 2019-06-30 LAB — POCT URINALYSIS DIPSTICK OB
Bilirubin, UA: NEGATIVE
Blood, UA: NEGATIVE
Glucose, UA: NEGATIVE
Ketones, UA: NEGATIVE
Leukocytes, UA: NEGATIVE
Nitrite, UA: NEGATIVE
POC,PROTEIN,UA: NEGATIVE
Spec Grav, UA: 1.01 (ref 1.010–1.025)
Urobilinogen, UA: 0.2 E.U./dL
pH, UA: 7 (ref 5.0–8.0)

## 2019-06-30 NOTE — Progress Notes (Signed)
ROB doing well, feels good movement. Anatomy u/s complete, see below. Discussed 28 wk visit. Pt opt to do 3 hr glucose test given she failed the early 1 hr and had to do 3 hr test. Reviewed that the test is fasting. She verbalizes and agrees to plan. Follow up 4 wks with Melody.   Philip Aspen, CNM    Patient Name: Vanessa Lindsey DOB: November 10, 1996 MRN: [id number] ULTRASOUND REPORT  Location: [facility] Date of Service: 06/30/2019   Indications: Anatomy follow up ultrasound Findings:  Vanessa Lindsey intrauterine pregnancy is visualized with FHR at 142 BPM.  Fetal presentation is Breech.  Placenta: posterior. Grade: 1 AFI: subjectively normal.  Anatomic survey is complete.   There is no free peritoneal fluid in the cul de sac.  Impression: 1. [redacted]w[redacted]d Viable Singleton Intrauterine pregnancy previously established criteria. 2. Normal Anatomy Scan is now complete  Recommendations: 1.Clinical correlation with the patient's History and Physical Exam.   Jenine M. Albertine Grates    RDMS

## 2019-06-30 NOTE — Patient Instructions (Signed)

## 2019-07-30 ENCOUNTER — Other Ambulatory Visit: Payer: BC Managed Care – PPO

## 2019-07-30 ENCOUNTER — Other Ambulatory Visit: Payer: Self-pay

## 2019-07-30 ENCOUNTER — Ambulatory Visit (INDEPENDENT_AMBULATORY_CARE_PROVIDER_SITE_OTHER): Payer: BC Managed Care – PPO | Admitting: Obstetrics and Gynecology

## 2019-07-30 VITALS — BP 97/64 | HR 109 | Wt 222.2 lb

## 2019-07-30 DIAGNOSIS — Z3492 Encounter for supervision of normal pregnancy, unspecified, second trimester: Secondary | ICD-10-CM

## 2019-07-30 DIAGNOSIS — Z3493 Encounter for supervision of normal pregnancy, unspecified, third trimester: Secondary | ICD-10-CM

## 2019-07-30 DIAGNOSIS — Z23 Encounter for immunization: Secondary | ICD-10-CM

## 2019-07-30 MED ORDER — TETANUS-DIPHTH-ACELL PERTUSSIS 5-2.5-18.5 LF-MCG/0.5 IM SUSP
0.5000 mL | Freq: Once | INTRAMUSCULAR | Status: AC
  Administered 2019-07-30: 0.5 mL via INTRAMUSCULAR

## 2019-07-30 NOTE — Progress Notes (Signed)
ROB and 3h GTT- doing well, discussed weight gain and constipation.to increase fresh veggies and fiber in food. Denies asthma flare up. Down to 2-3 cig/day. Encouraged to enroll in classes on line.

## 2019-07-30 NOTE — Progress Notes (Signed)
ROB- 3 hr gtt done today, tdap given, BC signed, having some constipation

## 2019-07-30 NOTE — Patient Instructions (Addendum)
Third Trimester of Pregnancy The third trimester is from week 28 through week 40 (months 7 through 9). The third trimester is a time when the unborn baby (fetus) is growing rapidly. At the end of the ninth month, the fetus is about 20 inches in length and weighs 6-10 pounds. Body changes during your third trimester Your body will continue to go through many changes during pregnancy. The changes vary from woman to woman. During the third trimester:  Your weight will continue to increase. You can expect to gain 25-35 pounds (11-16 kg) by the end of the pregnancy.  You may begin to get stretch marks on your hips, abdomen, and breasts.  You may urinate more often because the fetus is moving lower into your pelvis and pressing on your bladder.  You may develop or continue to have heartburn. This is caused by increased hormones that slow down muscles in the digestive tract.  You may develop or continue to have constipation because increased hormones slow digestion and cause the muscles that push waste through your intestines to relax.  You may develop hemorrhoids. These are swollen veins (varicose veins) in the rectum that can itch or be painful.  You may develop swollen, bulging veins (varicose veins) in your legs.  You may have increased body aches in the pelvis, back, or thighs. This is due to weight gain and increased hormones that are relaxing your joints.  You may have changes in your hair. These can include thickening of your hair, rapid growth, and changes in texture. Some women also have hair loss during or after pregnancy, or hair that feels dry or thin. Your hair will most likely return to normal after your baby is born.  Your breasts will continue to grow and they will continue to become tender. A yellow fluid (colostrum) may leak from your breasts. This is the first milk you are producing for your baby.  Your belly button may stick out.  You may notice more swelling in your hands,  face, or ankles.  You may have increased tingling or numbness in your hands, arms, and legs. The skin on your belly may also feel numb.  You may feel short of breath because of your expanding uterus.  You may have more problems sleeping. This can be caused by the size of your belly, increased need to urinate, and an increase in your body's metabolism.  You may notice the fetus "dropping," or moving lower in your abdomen (lightening).  You may have increased vaginal discharge.  You may notice your joints feel loose and you may have pain around your pelvic bone. What to expect at prenatal visits You will have prenatal exams every 2 weeks until week 36. Then you will have weekly prenatal exams. During a routine prenatal visit:  You will be weighed to make sure you and the baby are growing normally.  Your blood pressure will be taken.  Your abdomen will be measured to track your baby's growth.  The fetal heartbeat will be listened to.  Any test results from the previous visit will be discussed.  You may have a cervical check near your due date to see if your cervix has softened or thinned (effaced).  You will be tested for Group B streptococcus. This happens between 35 and 37 weeks. Your health care provider may ask you:  What your birth plan is.  How you are feeling.  If you are feeling the baby move.  If you have had any abnormal   symptoms, such as leaking fluid, bleeding, severe headaches, or abdominal cramping.  If you are using any tobacco products, including cigarettes, chewing tobacco, and electronic cigarettes.  If you have any questions. Other tests or screenings that may be performed during your third trimester include:  Blood tests that check for low iron levels (anemia).  Fetal testing to check the health, activity level, and growth of the fetus. Testing is done if you have certain medical conditions or if there are problems during the pregnancy.  Nonstress test  (NST). This test checks the health of your baby to make sure there are no signs of problems, such as the baby not getting enough oxygen. During this test, a belt is placed around your belly. The baby is made to move, and its heart rate is monitored during movement. What is false labor? False labor is a condition in which you feel small, irregular tightenings of the muscles in the womb (contractions) that usually go away with rest, changing position, or drinking water. These are called Braxton Hicks contractions. Contractions may last for hours, days, or even weeks before true labor sets in. If contractions come at regular intervals, become more frequent, increase in intensity, or become painful, you should see your health care provider. What are the signs of labor?  Abdominal cramps.  Regular contractions that start at 10 minutes apart and become stronger and more frequent with time.  Contractions that start on the top of the uterus and spread down to the lower abdomen and back.  Increased pelvic pressure and dull back pain.  A watery or bloody mucus discharge that comes from the vagina.  Leaking of amniotic fluid. This is also known as your "water breaking." It could be a slow trickle or a gush. Let your health care provider know if it has a color or strange odor. If you have any of these signs, call your health care provider right away, even if it is before your due date. Follow these instructions at home: Medicines  Follow your health care provider's instructions regarding medicine use. Specific medicines may be either safe or unsafe to take during pregnancy.  Take a prenatal vitamin that contains at least 600 micrograms (mcg) of folic acid.  If you develop constipation, try taking a stool softener if your health care provider approves. Eating and drinking   Eat a balanced diet that includes fresh fruits and vegetables, whole grains, good sources of protein such as meat, eggs, or tofu,  and low-fat dairy. Your health care provider will help you determine the amount of weight gain that is right for you.  Avoid raw meat and uncooked cheese. These carry germs that can cause birth defects in the baby.  If you have low calcium intake from food, talk to your health care provider about whether you should take a daily calcium supplement.  Eat four or five small meals rather than three large meals a day.  Limit foods that are high in fat and processed sugars, such as fried and sweet foods.  To prevent constipation: ? Drink enough fluid to keep your urine clear or pale yellow. ? Eat foods that are high in fiber, such as fresh fruits and vegetables, whole grains, and beans. Activity  Exercise only as directed by your health care provider. Most women can continue their usual exercise routine during pregnancy. Try to exercise for 30 minutes at least 5 days a week. Stop exercising if you experience uterine contractions.  Avoid heavy lifting.  Do   not exercise in extreme heat or humidity, or at high altitudes.  Wear low-heel, comfortable shoes.  Practice good posture.  You may continue to have sex unless your health care provider tells you otherwise. Relieving pain and discomfort  Take frequent breaks and rest with your legs elevated if you have leg cramps or low back pain.  Take warm sitz baths to soothe any pain or discomfort caused by hemorrhoids. Use hemorrhoid cream if your health care provider approves.  Wear a good support bra to prevent discomfort from breast tenderness.  If you develop varicose veins: ? Wear support pantyhose or compression stockings as told by your healthcare provider. ? Elevate your feet for 15 minutes, 3-4 times a day. Prenatal care  Write down your questions. Take them to your prenatal visits.  Keep all your prenatal visits as told by your health care provider. This is important. Safety  Wear your seat belt at all times when driving.  Make  a list of emergency phone numbers, including numbers for family, friends, the hospital, and police and fire departments. General instructions  Avoid cat litter boxes and soil used by cats. These carry germs that can cause birth defects in the baby. If you have a cat, ask someone to clean the litter box for you.  Do not travel far distances unless it is absolutely necessary and only with the approval of your health care provider.  Do not use hot tubs, steam rooms, or saunas.  Do not drink alcohol.  Do not use any products that contain nicotine or tobacco, such as cigarettes and e-cigarettes. If you need help quitting, ask your health care provider.  Do not use any medicinal herbs or unprescribed drugs. These chemicals affect the formation and growth of the baby.  Do not douche or use tampons or scented sanitary pads.  Do not cross your legs for long periods of time.  To prepare for the arrival of your baby: ? Take prenatal classes to understand, practice, and ask questions about labor and delivery. ? Make a trial run to the hospital. ? Visit the hospital and tour the maternity area. ? Arrange for maternity or paternity leave through employers. ? Arrange for family and friends to take care of pets while you are in the hospital. ? Purchase a rear-facing car seat and make sure you know how to install it in your car. ? Pack your hospital bag. ? Prepare the baby's nursery. Make sure to remove all pillows and stuffed animals from the baby's crib to prevent suffocation.  Visit your dentist if you have not gone during your pregnancy. Use a soft toothbrush to brush your teeth and be gentle when you floss. Contact a health care provider if:  You are unsure if you are in labor or if your water has broken.  You become dizzy.  You have mild pelvic cramps, pelvic pressure, or nagging pain in your abdominal area.  You have lower back pain.  You have persistent nausea, vomiting, or diarrhea.   You have an unusual or bad smelling vaginal discharge.  You have pain when you urinate. Get help right away if:  Your water breaks before 37 weeks.  You have regular contractions less than 5 minutes apart before 37 weeks.  You have a fever.  You are leaking fluid from your vagina.  You have spotting or bleeding from your vagina.  You have severe abdominal pain or cramping.  You have rapid weight loss or weight gain.  You have   shortness of breath with chest pain.  You notice sudden or extreme swelling of your face, hands, ankles, feet, or legs.  Your baby makes fewer than 10 movements in 2 hours.  You have severe headaches that do not go away when you take medicine.  You have vision changes. Summary  The third trimester is from week 28 through week 40, months 7 through 9. The third trimester is a time when the unborn baby (fetus) is growing rapidly.  During the third trimester, your discomfort may increase as you and your baby continue to gain weight. You may have abdominal, leg, and back pain, sleeping problems, and an increased need to urinate.  During the third trimester your breasts will keep growing and they will continue to become tender. A yellow fluid (colostrum) may leak from your breasts. This is the first milk you are producing for your baby.  False labor is a condition in which you feel small, irregular tightenings of the muscles in the womb (contractions) that eventually go away. These are called Braxton Hicks contractions. Contractions may last for hours, days, or even weeks before true labor sets in.  Signs of labor can include: abdominal cramps; regular contractions that start at 10 minutes apart and become stronger and more frequent with time; watery or bloody mucus discharge that comes from the vagina; increased pelvic pressure and dull back pain; and leaking of amniotic fluid. This information is not intended to replace advice given to you by your health  care provider. Make sure you discuss any questions you have with your health care provider. Document Released: 12/11/2001 Document Revised: 04/09/2019 Document Reviewed: 01/22/2017 Elsevier Patient Education  2020 [employer] Constipation, Adult Constipation is when a person has fewer bowel movements in a week than normal, has difficulty having a bowel movement, or has stools that are dry, hard, or larger than normal. Constipation may be caused by an underlying condition. It may become worse with age if a person takes certain medicines and does not take in enough fluids. Follow these instructions at home: Eating and drinking   Eat foods that have a lot of fiber, such as fresh fruits and vegetables, whole grains, and beans.  Limit foods that are high in fat, low in fiber, or overly processed, such as french fries, hamburgers, cookies, candies, and soda.  Drink enough fluid to keep your urine clear or pale yellow. General instructions  Exercise regularly or as told by your health care provider.  Go to the restroom when you have the urge to go. Do not hold it in.  Take over-the-counter and prescription medicines only as told by your health care provider. These include any fiber supplements.  Practice pelvic floor retraining exercises, such as deep breathing while relaxing the lower abdomen and pelvic floor relaxation during bowel movements.  Watch your condition for any changes.  Keep all follow-up visits as told by your health care provider. This is important. Contact a health care provider if:  You have pain that gets worse.  You have a fever.  You do not have a bowel movement after 4 days.  You vomit.  You are not hungry.  You lose weight.  You are bleeding from the anus.  You have thin, pencil-like stools. Get help right away if:  You have a fever and your symptoms suddenly get worse.  You leak stool or have blood in your stool.  Your abdomen is bloated.  You  have severe pain in your abdomen.  You  feel dizzy or you faint. This information is not intended to replace advice given to you by your health care provider. Make sure you discuss any questions you have with your health care provider. Document Released: 09/14/2004 Document Revised: 11/29/2017 Document Reviewed: 06/06/2016 Elsevier Patient Education  2020 [employer]. .remember all classes are online now  [facility] 2020 Prenatal Education Class Schedule Register at [email or web address] in the [place] or call [facility] at [phone] 9:00a-5:00p M-F  Childbirth Preparation Certified Childbirth Educators teach this 5 week course.  Expectant parents are encouraged to take this class in their 3rd trimester, completing it by their 35-36th week. Meets in [facility], Lower Level.  Mondays Thursdays  7:00-9:00 p 7:00-9:00 p  January 27-February 24 March 12-April 9  May 11-June15 July 23-August 20  September 14-October 12 October 22-November 19   No Class on Memorial Day- May 27  Naturally Prepared Child Birth & Labor Support Interested in an unmediated labor or birth?  Learn the stages of labor, labor progression and massage techniques, breathing methods, relaxation skills, and the importance of continuous labor support.  Family members and support personnel are encouraged to attend.  This course meets on Tuesday from 7:00pm-9:00pm  January 21 September 15  March 17 November 17  May 19    Water Birth Class This two-hour course will help you discover whether water birth is the right fit for you.  Family member and support personnel are encouraged to attend.  Some obstetrical practices require this class in order to pursue hydrotherapy or water birth, check with your healthcare provider.  This course meets on Wednesday from 7:00pm-9:00pm  February12  August 12  April 8 October 14  June 10 December 9   Childbirth Preparation Refresher Course  For those who have previously attended Prepared Childbirth Preparation classes, this class in incorporated into the 3rd and 4th classes in the Monday night childbirth series.  Course meets in the [facility]. Lower Level from 7:00p - 9:00p  February 10 & 17 September 28  June 1 & 8 October 5        Weekend Childbirth Waymon Amato Classes are held Saturday & Sunday, 1:00 5:00p Course meets in [facility] [place] Level  January 4 & 5 March 7 & 8  May 2 & 3 August 1 & 2  November 7 & 8    The BirthPlace Tours Free tours are held on the third Sunday of each month at 3 pm.  The tour meets in the third floor waiting area and will take approximately 30 minutes.  Tours are also included in Childbirth class series as well as Brother/Sister class.  An online virtual tour can be seen at [email or web address].  Breastfeeding Fundamentals This course is designed for families who are planning to or thinking about breastfeeding their newborn.  Basic concepts of breastfeeding are covered, including: both the benefits and challenges of breastfeeding, latching, positioning, maintaining milk supply while away from baby and much more.  This course meets the second Tuesday of each month from 7:00p-9:00pm in the [facility], Lower Level  January 14 February 11 March 10  April 14 May 12 June 9  July no classes August 11 September 8  October 13 November 10 December 8   [facility] Breastfeeding Support Group This support group is free for all families looking for breastfeeding and/or pumping support.  We encourage you to join [place] on the 1st and 3rd Thursday of each month  at [facility] in [facility] Room 101 from 11:30a-1:00p.  An Emergency planning/management officernternational Board-Certified Lactation Consultant (IBCLC) will be at each meeting, and a baby scale will be available for those wanting an infant weight check.  In addition to having your questions/concerns addressed by an IBCLC, attendees find support in the opportunity  to connect with other new and breastfeeding parents. No Registration Required  Newborn Essentials This course covers bathing, diapering, swaddling and more with practice on lifelike dolls. Participants will also learn safety tips and infant CPR (Not for certification).  It is held the 1st Wednesday of each month from 7:00p-9:00p in the [facility], Lower level.  February 5 March 4 April 1  May 6 June 3 July- No Class  August 5 September 2 October 7  November 4 December 2       Preparing Big Brother & Sister This one session course prepares children and their parents for the arrival of a new baby.  It is held on the 1st Tuesday of each month from 6:30p - 8:00p. Course meets in the [facility], Lower Level  January 7 February 4 March 3  April 7 May 5 June 2  July-No Class August 4 September 1  October 6 November 3 December 1   [employer] D[employer] This nationally acclaimed class helps expecting and new dads with the basic skills and confidence to bond with their infants, support their mates, and provide a safe and healthy home environment for their new family. Classes are held the 2nd Saturday of every month from 9:00a - 12:00 noon.  Course meets in the [facility] Lower level.  February 8 April 11 June 13  August 8 October 10 No class in December

## 2019-07-31 LAB — GESTATIONAL GLUCOSE TOLERANCE
Glucose, Fasting: 94 mg/dL (ref 65–94)
Glucose, GTT - 1 Hour: 181 mg/dL — ABNORMAL HIGH (ref 65–179)
Glucose, GTT - 2 Hour: 103 mg/dL (ref 65–154)
Glucose, GTT - 3 Hour: 74 mg/dL (ref 65–139)

## 2019-08-04 ENCOUNTER — Other Ambulatory Visit: Payer: Self-pay

## 2019-08-04 ENCOUNTER — Observation Stay
Admission: EM | Admit: 2019-08-04 | Discharge: 2019-08-05 | Disposition: A | Discharge status: Home / Self Care | Payer: BC Managed Care – PPO | Attending: Certified Nurse Midwife | Admitting: Certified Nurse Midwife

## 2019-08-04 DIAGNOSIS — O26893 Other specified pregnancy related conditions, third trimester: Secondary | ICD-10-CM | POA: Insufficient documentation

## 2019-08-04 DIAGNOSIS — Z3A29 29 weeks gestation of pregnancy: Secondary | ICD-10-CM | POA: Diagnosis not present

## 2019-08-04 DIAGNOSIS — O368130 Decreased fetal movements, third trimester, not applicable or unspecified: Principal | ICD-10-CM | POA: Insufficient documentation

## 2019-08-04 DIAGNOSIS — R109 Unspecified abdominal pain: Secondary | ICD-10-CM | POA: Diagnosis not present

## 2019-08-04 MED ORDER — ACETAMINOPHEN 500 MG PO TABS
1000.0000 mg | ORAL_TABLET | Freq: Once | ORAL | Status: AC
  Administered 2019-08-05: 1000 mg via ORAL
  Filled 2019-08-04: qty 2

## 2019-08-04 MED ORDER — HYDROXYZINE HCL 25 MG PO TABS
25.0000 mg | ORAL_TABLET | Freq: Once | ORAL | Status: AC
  Administered 2019-08-05: 25 mg via ORAL
  Filled 2019-08-04: qty 1

## 2019-08-04 NOTE — OB Triage Note (Signed)
Pt G1P0 reports @29weeks  for lower abd pain that started around 3pm today. Rates pain5-7/10 that is constant. Pain is worse when up and moving or when bending over. Pt reports she has not taken anything for the pain. Reports feeling baby move but not as much as normal. Denies CTX/LOF/bleeding. VSS. Monitors applied. Will continue to assess.

## 2019-08-05 DIAGNOSIS — R109 Unspecified abdominal pain: Secondary | ICD-10-CM

## 2019-08-05 DIAGNOSIS — Z3A29 29 weeks gestation of pregnancy: Secondary | ICD-10-CM | POA: Diagnosis not present

## 2019-08-05 DIAGNOSIS — O368130 Decreased fetal movements, third trimester, not applicable or unspecified: Secondary | ICD-10-CM

## 2019-08-05 DIAGNOSIS — O26893 Other specified pregnancy related conditions, third trimester: Secondary | ICD-10-CM | POA: Diagnosis not present

## 2019-08-05 LAB — URINALYSIS, ROUTINE W REFLEX MICROSCOPIC
Bacteria, UA: NONE SEEN
Bilirubin Urine: NEGATIVE
Glucose, UA: NEGATIVE mg/dL
Ketones, ur: NEGATIVE mg/dL
Leukocytes,Ua: NEGATIVE
Nitrite: NEGATIVE
Protein, ur: NEGATIVE mg/dL
Specific Gravity, Urine: 1.021 (ref 1.005–1.030)
pH: 6 (ref 5.0–8.0)

## 2019-08-06 LAB — URINE CULTURE: Culture: <10000 — AB

## 2019-08-06 NOTE — Discharge Summary (Signed)
Obstetric Discharge Summary  Patient ID: ABAGAIL LIMB MRN: [id number] DOB/AGE: 05/14/1996 23 y.o.   Date of Admission: 08/04/2019  Date of Discharge: 08/05/2019  Admitting Diagnosis: Observation at [redacted]w[redacted]d  Secondary Diagnosis: Decreased fetal movment, Abdominal pain in pregnancy     Discharge Diagnosis: No other diagnosis   Antepartum Procedures: NST, Oral Tylenol and [facility]   L&D OB Triage Note  RONNISHA FELBER is a 23 y.o. G1P0000 female at [redacted]w[redacted]d G1P0000 female at [redacted]w[redacted]d, EDD Estimated Date of Delivery: 10/20/19 who presented to triage for complaints of intermittent lower abdominal pain that increases with movement and decreased fetal movement.  She was evaluated by the nurses with no significant findings for fetal distress, preterm labor or acute abdomen. Vital signs stable. An NST was performed and has been reviewed by CNM. She was treated with oral Tylenol and Vistaril.   NST INTERPRETATION: Indications: decreased fetal movement and rule out uterine contractions  Mode: External Baseline Rate (A): 160 bpm(fht) Variability: Moderate Accelerations: 15 x 15 Decelerations: None    Contraction Frequency (min): none noted  Impression: reactive   Plan: NST performed was reviewed and was found to be reactive. She was discharged home with bleeding/labor precautions.  Continue routine prenatal care. Follow up with Midwife as previously scheduled or sooner if needed.   Discharge Instructions: Per After Visit Summary.  Activity: As tolerated.   Diet: Regular  Medications: Allergies as of 08/05/2019      Reactions   Sulfa Antibiotics Hives      Medication List    ASK your doctor about these medications   albuterol 108 (90 Base) MCG/ACT inhaler Commonly known as: VENTOLIN HFA Inhale 2 puffs into the lungs every 6 (six) hours as needed.   Ciprodex OTIC suspension Generic drug: ciprofloxacin-dexamethasone Place 4 drops into both ears 2 (two) times daily.    prenatal multivitamin Tabs tablet Take 1 tablet by mouth daily at 12 noon.       Discharged Condition: stable  Discharged to: home   Diona Fanti, CNM [facility], [facility]

## 2019-08-12 ENCOUNTER — Telehealth: Payer: Self-pay

## 2019-08-12 NOTE — Telephone Encounter (Signed)
Coronavirus (COVID-19) Are you at risk?  Are you at risk for the Coronavirus (COVID-19)?  To be considered HIGH RISK for Coronavirus (COVID-19), you have to meet the following criteria:  . Traveled to [place], [place], [place], [place] or [place]; or in the [place] to [place], [place], [place], or [place]; and have fever, cough, and shortness of breath within the last 2 weeks of travel OR . Been in close contact with a person diagnosed with COVID-19 within the last 2 weeks and have fever, cough, and shortness of breath . IF YOU DO NOT MEET THESE CRITERIA, YOU ARE CONSIDERED LOW RISK FOR COVID-19.  What to do if you are HIGH RISK for COVID-19?  . If you are having a medical emergency, call 911. . Seek medical care right away. Before you go to a doctor's office, urgent care or emergency department, call ahead and tell them about your recent travel, contact with someone diagnosed with COVID-19, and your symptoms. You should receive instructions from your physician's office regarding next steps of care.  . When you arrive at healthcare provider, tell the healthcare staff immediately you have returned from visiting [place], [place], [place], [place] or [place]; or traveled in the [place] to [place], [place], [place], or [place]; in the last two weeks or you have been in close contact with a person diagnosed with COVID-19 in the last 2 weeks.   . Tell the health care staff about your symptoms: fever, cough and shortness of breath. . After you have been seen by a medical provider, you will be either: o Tested for (COVID-19) and discharged home on quarantine except to seek medical care if symptoms worsen, and asked to  - Stay home and avoid contact with others until you get your results (4-5 days)  - Avoid travel on public transportation if possible (such as bus, train, or airplane) or o Sent to the Emergency Department by EMS for evaluation, COVID-19 testing, and possible  admission depending on your condition and test results.  What to do if you are LOW RISK for COVID-19?  Reduce your risk of any infection by using the same precautions used for avoiding the common cold or flu:  . Wash your hands often with soap and warm water for at least 20 seconds.  If soap and water are not readily available, use an alcohol-based hand sanitizer with at least 60% alcohol.  . If coughing or sneezing, cover your mouth and nose by coughing or sneezing into the elbow areas of your shirt or coat, into a tissue or into your sleeve (not your hands). . Avoid shaking hands with others and consider head nods or verbal greetings only. . Avoid touching your eyes, nose, or mouth with unwashed hands.  . Avoid close contact with people who are Ferd Horrigan. . Avoid places or events with large numbers of people in one location, like concerts or sporting events. . Carefully consider travel plans you have or are making. . If you are planning any travel outside or inside the [place], visit the CDC's Travelers' Health webpage for the latest health notices. . If you have some symptoms but not all symptoms, continue to monitor at home and seek medical attention if your symptoms worsen. . If you are having a medical emergency, call 911.  08/12/19 SCREENING NEG SLS ADDITIONAL HEALTHCARE OPTIONS FOR PATIENTS  [facility] / e-Visit: [email or web address]         [facility]: [phone]    [facility]: [phone]                   [facility]: [phone]  

## 2019-08-12 NOTE — Telephone Encounter (Signed)
Questions regarding if she can get wisdom teeth removed while pregnant. Pls return her call.

## 2019-08-13 ENCOUNTER — Other Ambulatory Visit: Payer: Self-pay

## 2019-08-13 ENCOUNTER — Ambulatory Visit (INDEPENDENT_AMBULATORY_CARE_PROVIDER_SITE_OTHER): Payer: BC Managed Care – PPO | Admitting: Certified Nurse Midwife

## 2019-08-13 VITALS — BP 104/68 | HR 102 | Wt 221.4 lb

## 2019-08-13 DIAGNOSIS — Z3493 Encounter for supervision of normal pregnancy, unspecified, third trimester: Secondary | ICD-10-CM

## 2019-08-13 LAB — POCT URINALYSIS DIPSTICK OB
Bilirubin, UA: NEGATIVE
Blood, UA: NEGATIVE
Glucose, UA: NEGATIVE
Ketones, UA: NEGATIVE
Leukocytes, UA: NEGATIVE
Nitrite, UA: NEGATIVE
POC,PROTEIN,UA: NEGATIVE
Spec Grav, UA: 1.02 (ref 1.010–1.025)
Urobilinogen, UA: 0.2 E.U./dL
pH, UA: 6 (ref 5.0–8.0)

## 2019-08-13 NOTE — Telephone Encounter (Signed)
Called and spoke with patient after getting advise from [facility].  Advised patient it is ok to have wisdom teeth removed under local anesthesia only.  Patient verbalized understanding.

## 2019-08-13 NOTE — Progress Notes (Signed)
ROB-No complaints.  

## 2019-08-13 NOTE — Patient Instructions (Addendum)
[facility] Pediatrician List   [facility]  [address] [place], [place], [place] [address]  Phone: [phone]   [facility] (second location)  [address] [place], [place] [address]  Phone: [phone]   [facility]) [address] [place], [place], [place] [address] Phone: [phone]   [facility]  [address] [place], [place] [address]  Phone: [phone]  Back Pain in Pregnancy Back pain during pregnancy is common. Back pain may be caused by several factors that are related to changes during your pregnancy. Follow these instructions at home: Managing pain, stiffness, and swelling      If directed, for sudden (acute) back pain, put ice on the painful area. ? Put ice in a plastic bag. ? Place a towel between your skin and the bag. ? Leave the ice on for 20 minutes, 2-3 times per day.  If directed, apply heat to the affected area before you exercise. Use the heat source that your health care provider recommends, such as a moist heat pack or a heating pad. ? Place a towel between your skin and the heat source. ? Leave the heat on for 20-30 minutes. ? Remove the heat if your skin turns bright red. This is especially important if you are unable to feel pain, heat, or cold. You may have a greater risk of getting burned.  If directed, massage the affected area. Activity  Exercise as told by your health care provider. Gentle exercise is the best way to prevent or manage back pain.  Listen to your body when lifting. If lifting hurts, ask for help or bend your knees. This uses your leg muscles instead of your back muscles.  Squat down when picking up something from the floor. Do not bend over.  Only use bed rest for short periods as told by your health care provider. Bed rest should only be used for the most severe episodes of back pain. Standing, sitting, and lying down  Do not stand in one place for long periods of  time.  Use good posture when sitting. Make sure your head rests over your shoulders and is not hanging forward. Use a pillow on your lower back if necessary.  Try sleeping on your side, preferably the left side, with a pregnancy support pillow or 1-2 regular pillows between your legs. ? If you have back pain after a night's rest, your bed may be too soft. ? A firm mattress may provide more support for your back during pregnancy. General instructions  Do not wear high heels.  Eat a healthy diet. Try to gain weight within your health care provider's recommendations.  Use a maternity girdle, elastic sling, or back brace as told by your health care provider.  Take over-the-counter and prescription medicines only as told by your health care provider.  Work with a physical therapist or massage therapist to find ways to manage back pain. Acupuncture or massage therapy may be helpful.  Keep all follow-up visits as told by your health care provider. This is important. Contact a health care provider if:  Your back pain interferes with your daily activities.  You have increasing pain in other parts of your body. Get help right away if:  You develop numbness, tingling, weakness, or problems with the use of your arms or legs.  You develop severe back pain that is not controlled with medicine.  You have a change in bowel or bladder control.  You develop shortness of breath,  dizziness, or you faint.  You develop nausea, vomiting, or sweating.  You have back pain that is a rhythmic, cramping pain similar to labor pains. Labor pain is usually 1-2 minutes apart, lasts for about 1 minute, and involves a bearing down feeling or pressure in your pelvis.  You have back pain and your water breaks or you have vaginal bleeding.  You have back pain or numbness that travels down your leg.  Your back pain developed after you fell.  You develop pain on one side of your back.  You see blood in your  urine.  You develop skin blisters in the area of your back pain. Summary  Back pain may be caused by several factors that are related to changes during your pregnancy.  Follow instructions as told by your health care provider for managing pain, stiffness, and swelling.  Exercise as told by your health care provider. Gentle exercise is the best way to prevent or manage back pain.  Take over-the-counter and prescription medicines only as told by your health care provider.  Keep all follow-up visits as told by your health care provider. This is important. This information is not intended to replace advice given to you by your health care provider. Make sure you discuss any questions you have with your health care provider. Document Released: 03/27/2006 Document Revised: 04/07/2019 Document Reviewed: 06/04/2018 Elsevier Patient Education  2020 [employer] Breastfeeding  Choosing to breastfeed is one of the best decisions you can make for yourself and your baby. A change in hormones during pregnancy causes your breasts to make breast milk in your milk-producing glands. Hormones prevent breast milk from being released before your baby is born. They also prompt milk flow after birth. Once breastfeeding has begun, thoughts of your baby, as well as his or her sucking or crying, can stimulate the release of milk from your milk-producing glands. Benefits of breastfeeding Research shows that breastfeeding offers many health benefits for infants and mothers. It also offers a cost-free and convenient way to feed your baby. For your baby  Your first milk (colostrum) helps your baby's digestive system to function better.  Special cells in your milk (antibodies) help your baby to fight off infections.  Breastfed babies are less likely to develop asthma, allergies, obesity, or type 2 diabetes. They are also at lower risk for sudden infant death syndrome (SIDS).  Nutrients in breast milk are better  able to meet your babys needs compared to infant formula.  Breast milk improves your baby's brain development. For you  Breastfeeding helps to create a very special bond between you and your baby.  Breastfeeding is convenient. Breast milk costs nothing and is always available at the correct temperature.  Breastfeeding helps to burn calories. It helps you to lose the weight that you gained during pregnancy.  Breastfeeding makes your uterus return faster to its size before pregnancy. It also slows bleeding (lochia) after you give birth.  Breastfeeding helps to lower your risk of developing type 2 diabetes, osteoporosis, rheumatoid arthritis, cardiovascular disease, and breast, ovarian, uterine, and endometrial cancer later in life. Breastfeeding basics Starting breastfeeding  Find a comfortable place to sit or lie down, with your neck and back well-supported.  Place a pillow or a rolled-up blanket under your baby to bring him or her to the level of your breast (if you are seated). Nursing pillows are specially designed to help support your arms and your baby while you breastfeed.  Make sure that your baby's  tummy (abdomen) is facing your abdomen.  Gently massage your breast. With your fingertips, massage from the outer edges of your breast inward toward the nipple. This encourages milk flow. If your milk flows slowly, you may need to continue this action during the feeding.  Support your breast with 4 fingers underneath and your thumb above your nipple (make the letter "C" with your hand). Make sure your fingers are well away from your nipple and your babys mouth.  Stroke your baby's lips gently with your finger or nipple.  When your baby's mouth is open wide enough, quickly bring your baby to your breast, placing your entire nipple and as much of the areola as possible into your baby's mouth. The areola is the colored area around your nipple. ? More areola should be visible above your  baby's upper lip than below the lower lip. ? Your baby's lips should be opened and extended outward (flanged) to ensure an adequate, comfortable latch. ? Your baby's tongue should be between his or her lower gum and your breast.  Make sure that your baby's mouth is correctly positioned around your nipple (latched). Your baby's lips should create a seal on your breast and be turned out (everted).  It is common for your baby to suck about 2-3 minutes in order to start the flow of breast milk. Latching Teaching your baby how to latch onto your breast properly is very important. An improper latch can cause nipple pain, decreased milk supply, and poor weight gain in your baby. Also, if your baby is not latched onto your nipple properly, he or she may swallow some air during feeding. This can make your baby fussy. Burping your baby when you switch breasts during the feeding can help to get rid of the air. However, teaching your baby to latch on properly is still the best way to prevent fussiness from swallowing air while breastfeeding. Signs that your baby has successfully latched onto your nipple  Silent tugging or silent sucking, without causing you pain. Infant's lips should be extended outward (flanged).  Swallowing heard between every 3-4 sucks once your milk has started to flow (after your let-down milk reflex occurs).  Muscle movement above and in front of his or her ears while sucking. Signs that your baby has not successfully latched onto your nipple  Sucking sounds or smacking sounds from your baby while breastfeeding.  Nipple pain. If you think your baby has not latched on correctly, slip your finger into the corner of your babys mouth to break the suction and place it between your baby's gums. Attempt to start breastfeeding again. Signs of successful breastfeeding Signs from your baby  Your baby will gradually decrease the number of sucks or will completely stop sucking.  Your baby  will fall asleep.  Your baby's body will relax.  Your baby will retain a small amount of milk in his or her mouth.  Your baby will let go of your breast by himself or herself. Signs from you  Breasts that have increased in firmness, weight, and size 1-3 hours after feeding.  Breasts that are softer immediately after breastfeeding.  Increased milk volume, as well as a change in milk consistency and color by the fifth day of breastfeeding.  Nipples that are not sore, cracked, or bleeding. Signs that your baby is getting enough milk  Wetting at least 1-2 diapers during the first 24 hours after birth.  Wetting at least 5-6 diapers every 24 hours for the first  week after birth. The urine should be clear or pale yellow by the age of 5 days.  Wetting 6-8 diapers every 24 hours as your baby continues to grow and develop.  At least 3 stools in a 24-hour period by the age of 5 days. The stool should be soft and yellow.  At least 3 stools in a 24-hour period by the age of 7 days. The stool should be seedy and yellow.  No loss of weight greater than 10% of birth weight during the first 3 days of life.  Average weight gain of 4-7 oz (113-198 g) per week after the age of 4 days.  Consistent daily weight gain by the age of 5 days, without weight loss after the age of 2 weeks. After a feeding, your baby may spit up a small amount of milk. This is normal. Breastfeeding frequency and duration Frequent feeding will help you make more milk and can prevent sore nipples and extremely full breasts (breast engorgement). Breastfeed when you feel the need to reduce the fullness of your breasts or when your baby shows signs of hunger. This is called "breastfeeding on demand." Signs that your baby is hungry include:  Increased alertness, activity, or restlessness.  Movement of the head from side to side.  Opening of the mouth when the corner of the mouth or cheek is stroked (rooting).  Increased  sucking sounds, smacking lips, cooing, sighing, or squeaking.  Hand-to-mouth movements and sucking on fingers or hands.  Fussing or crying. Avoid introducing a pacifier to your baby in the first 4-6 weeks after your baby is born. After this time, you may choose to use a pacifier. Research has shown that pacifier use during the first year of a baby's life decreases the risk of sudden infant death syndrome (SIDS). Allow your baby to feed on each breast as long as he or she wants. When your baby unlatches or falls asleep while feeding from the first breast, offer the second breast. Because newborns are often sleepy in the first few weeks of life, you may need to awaken your baby to get him or her to feed. Breastfeeding times will vary from baby to baby. However, the following rules can serve as a guide to help you make sure that your baby is properly fed:  Newborns (babies 62 weeks of age or younger) may breastfeed every 1-3 hours.  Newborns should not go without breastfeeding for longer than 3 hours during the day or 5 hours during the night.  You should breastfeed your baby a minimum of 8 times in a 24-hour period. Breast milk pumping     Pumping and storing breast milk allows you to make sure that your baby is exclusively fed your breast milk, even at times when you are unable to breastfeed. This is especially important if you go back to work while you are still breastfeeding, or if you are not able to be present during feedings. Your lactation consultant can help you find a method of pumping that works best for you and give you guidelines about how long it is safe to store breast milk. Caring for your breasts while you breastfeed Nipples can become dry, cracked, and sore while breastfeeding. The following recommendations can help keep your breasts moisturized and healthy:  Avoid using soap on your nipples.  Wear a supportive bra designed especially for nursing. Avoid wearing underwire-style  bras or extremely tight bras (sports bras).  Air-dry your nipples for 3-4 minutes after each feeding.  Use only cotton bra pads to absorb leaked breast milk. Leaking of breast milk between feedings is normal.  Use lanolin on your nipples after breastfeeding. Lanolin helps to maintain your skin's normal moisture barrier. Pure lanolin is not harmful (not toxic) to your baby. You may also hand express a few drops of breast milk and gently massage that milk into your nipples and allow the milk to air-dry. In the first few weeks after giving birth, some women experience breast engorgement. Engorgement can make your breasts feel heavy, warm, and tender to the touch. Engorgement peaks within 3-5 days after you give birth. The following recommendations can help to ease engorgement:  Completely empty your breasts while breastfeeding or pumping. You may want to start by applying warm, moist heat (in the shower or with warm, water-soaked hand towels) just before feeding or pumping. This increases circulation and helps the milk flow. If your baby does not completely empty your breasts while breastfeeding, pump any extra milk after he or she is finished.  Apply ice packs to your breasts immediately after breastfeeding or pumping, unless this is too uncomfortable for you. To do this: ? Put ice in a plastic bag. ? Place a towel between your skin and the bag. ? Leave the ice on for 20 minutes, 2-3 times a day.  Make sure that your baby is latched on and positioned properly while breastfeeding. If engorgement persists after 48 hours of following these recommendations, contact your health care provider or a Advertising copywriter. Overall health care recommendations while breastfeeding  Eat 3 healthy meals and 3 snacks every day. Well-nourished mothers who are breastfeeding need an additional 450-500 calories a day. You can meet this requirement by increasing the amount of a balanced diet that you eat.  Drink  enough water to keep your urine pale yellow or clear.  Rest often, relax, and continue to take your prenatal vitamins to prevent fatigue, stress, and low vitamin and mineral levels in your body (nutrient deficiencies).  Do not use any products that contain nicotine or tobacco, such as cigarettes and e-cigarettes. Your baby may be harmed by chemicals from cigarettes that pass into breast milk and exposure to secondhand smoke. If you need help quitting, ask your health care provider.  Avoid alcohol.  Do not use illegal drugs or marijuana.  Talk with your health care provider before taking any medicines. These include over-the-counter and prescription medicines as well as vitamins and herbal supplements. Some medicines that may be harmful to your baby can pass through breast milk.  It is possible to become pregnant while breastfeeding. If birth control is desired, ask your health care provider about options that will be safe while breastfeeding your baby. Where to find more information: [employer]: www.llli.org Contact a health care provider if:  You feel like you want to stop breastfeeding or have become frustrated with breastfeeding.  Your nipples are cracked or bleeding.  Your breasts are red, tender, or warm.  You have: ? Painful breasts or nipples. ? A swollen area on either breast. ? A fever or chills. ? Nausea or vomiting. ? Drainage other than breast milk from your nipples.  Your breasts do not become full before feedings by the fifth day after you give birth.  You feel sad and depressed.  Your baby is: ? Too sleepy to eat well. ? Having trouble sleeping. ? More than 57 week old and wetting fewer than 6 diapers in a 24-hour period. ? Not gaining  weight by 21 days of age.  Your baby has fewer than 3 stools in a 24-hour period.  Your baby's skin or the white parts of his or her eyes become yellow. Get help right away if:  Your baby is overly tired  (lethargic) and does not want to wake up and feed.  Your baby develops an unexplained fever. Summary  Breastfeeding offers many health benefits for infant and mothers.  Try to breastfeed your infant when he or she shows early signs of hunger.  Gently tickle or stroke your baby's lips with your finger or nipple to allow the baby to open his or her mouth. Bring the baby to your breast. Make sure that much of the areola is in your baby's mouth. Offer one side and burp the baby before you offer the other side.  Talk with your health care provider or lactation consultant if you have questions or you face problems as you breastfeed. This information is not intended to replace advice given to you by your health care provider. Make sure you discuss any questions you have with your health care provider. Document Released: 12/17/2005 Document Revised: 03/13/2018 Document Reviewed: 01/18/2017 Elsevier Patient Education  Saline. Pain Relief During Labor and Delivery Many things can cause pain during labor and delivery, including:  Pressure on bones and ligaments due to the baby moving through the pelvis.  Stretching of tissues due to the baby moving through the birth canal.  Muscle tension due to anxiety or nervousness.  The uterus tightening (contracting) and relaxing to help move the baby. There are many ways to deal with the pain of labor and delivery. They include:  Taking prenatal classes. Taking these classes helps you know what to expect during your babys birth. What you learn will increase your confidence and decrease your anxiety.  Practicing relaxation techniques or doing relaxing activities, such as: ? Focused breathing. ? Meditation. ? Visualization. ? Aroma therapy. ? Listening to your favorite music. ? Hypnosis.  Taking a warm shower or bath (hydrotherapy). This may: ? Provide comfort and relaxation. ? Lessen your perception of pain. ? Decrease the amount of  pain medicine needed. ? Decrease the length of labor.  Getting a massage or counterpressure on your back.  Applying warm packs or ice packs.  Changing positions often, moving around, or using a birthing ball.  Getting: ? Pain medicine through an IV or injection into a muscle. ? Pain medicine inserted into your spinal column. ? Injections of sterile water just under the skin on your lower back (intradermal injections). ? Laughing gas (nitrous oxide). Discuss your pain control options with your health care provider during your prenatal visits. Explore the options offered by your hospital or birth center. What kinds of medicine are available? There are two kinds of medicines that can be used to relieve pain during labor and delivery:  Analgesics. These medicines decrease pain without causing you to lose feeling or the ability to move your muscles.  Anesthetics. These medicines block feeling in the body and can decrease your ability to move freely. Both of these kinds of medicine can cause minor side effects, such as nausea, trouble concentrating, and sleepiness. They can also decrease the baby's heart rate before birth and affect the babys breathing rate after birth. For this reason, health care providers are careful about when and how much medicine is given. What are specific medicines and procedures that provide pain relief? Local Anesthetics Local anesthetics are used to numb a  small area of the body. They may be used along with another kind of anesthetic or used to numb the nerves of the vagina, cervix, and perineum during the second stage of labor. General Anesthetics General anesthetics cause you to lose consciousness so you do not feel pain. They are usually only used for an emergency cesarean delivery. General anesthetics are given through an IV tube and a mask. Pudendal Block A pudendal block is a form of local anesthetic. It may be used to relieve the pain associated with pushing  or stretching of the perineum at the time of delivery or to further numb the perineum. A pudendal block is done by injecting numbing medicine through the vaginal wall into a nerve in the pelvis. Epidural Analgesia Epidural analgesia is given through a flexible IV catheter that is inserted into the lower back. Numbing medicine is delivered continuously to the area near your spinal column nerves (epidural space). After having this type of analgesia, you may be able to move your legs but you most likely will not be able to walk. Depending on the amount of medicine given, you may lose all feeling in the lower half of your body, or you may retain some level of sensation, including the urge to push. Epidural analgesia can be used to provide pain relief for a vaginal birth. Spinal Block A spinal block is similar to epidural analgesia, but the medicine is injected into the spinal fluid instead of the epidural space. A spinal block is only given once. It starts to relieve pain quickly, but the pain relief lasts only 1-6 hours. Spinal blocks can be used for cesarean deliveries. Combined Spinal-Epidural (CSE) Block A CSE block combines the effects of a spinal block and epidural analgesia. The spinal block works quickly to block all pain. The epidural analgesia provides continuous pain relief, even after the effects of the spinal block have worn off. This information is not intended to replace advice given to you by your health care provider. Make sure you discuss any questions you have with your health care provider. Document Released: 04/04/2009 Document Revised: 11/29/2017 Document Reviewed: 05/09/2016 Elsevier Patient Education  2020 [employer] Inc. Vaginal Delivery  Vaginal delivery means that you give birth by pushing your baby out of your birth canal (vagina). A team of health care providers will help you before, during, and after vaginal delivery. Birth experiences are unique for every woman and every  pregnancy, and birth experiences vary depending on where you choose to give birth. What happens when I arrive at the birth center or hospital? Once you are in labor and have been admitted into the hospital or birth center, your health care provider may:  Review your pregnancy history and any concerns that you have.  Insert an IV into one of your veins. This may be used to give you fluids and medicines.  Check your blood pressure, pulse, temperature, and heart rate (vital signs).  Check whether your bag of water (amniotic sac) has broken (ruptured).  Talk with you about your birth plan and discuss pain control options. Monitoring Your health care provider may monitor your contractions (uterine monitoring) and your baby's heart rate (fetal monitoring). You may need to be monitored:  Often, but not continuously (intermittently).  All the time or for long periods at a time (continuously). Continuous monitoring may be needed if: ? You are taking certain medicines, such as medicine to relieve pain or make your contractions stronger. ? You have pregnancy or labor complications. Monitoring  may be done by:  Placing a special stethoscope or a handheld monitoring device on your abdomen to check your baby's heartbeat and to check for contractions.  Placing monitors on your abdomen (external monitors) to record your baby's heartbeat and the frequency and length of contractions.  Placing monitors inside your uterus through your vagina (internal monitors) to record your baby's heartbeat and the frequency, length, and strength of your contractions. Depending on the type of monitor, it may remain in your uterus or on your baby's head until birth.  Telemetry. This is a type of continuous monitoring that can be done with external or internal monitors. Instead of having to stay in bed, you are able to move around during telemetry. Physical exam Your health care provider may perform frequent physical  exams. This may include:  Checking how and where your baby is positioned in your uterus.  Checking your cervix to determine: ? Whether it is thinning out (effacing). ? Whether it is opening up (dilating). What happens during labor and delivery?  Normal labor and delivery is divided into the following three stages: Stage 1  This is the longest stage of labor.  This stage can last for hours or days.  Throughout this stage, you will feel contractions. Contractions generally feel mild, infrequent, and irregular at first. They get stronger, more frequent (about every 2-3 minutes), and more regular as you move through this stage.  This stage ends when your cervix is completely dilated to 4 inches (10 cm) and completely effaced. Stage 2  This stage starts once your cervix is completely effaced and dilated and lasts until the delivery of your baby.  This stage may last from 20 minutes to 2 hours.  This is the stage where you will feel an urge to push your baby out of your vagina.  You may feel stretching and burning pain, especially when the widest part of your baby's head passes through the vaginal opening (crowning).  Once your baby is delivered, the umbilical cord will be clamped and cut. This usually occurs after waiting a period of 1-2 minutes after delivery.  Your baby will be placed on your bare chest (skin-to-skin contact) in an upright position and covered with a warm blanket. Watch your baby for feeding cues, like rooting or sucking, and help the baby to your breast for his or her first feeding. Stage 3  This stage starts immediately after the birth of your baby and ends after you deliver the placenta.  This stage may take anywhere from 5 to 30 minutes.  After your baby has been delivered, you will feel contractions as your body expels the placenta and your uterus contracts to control bleeding. What can I expect after labor and delivery?  After labor is over, you and your  baby will be monitored closely until you are ready to go home to ensure that you are both healthy. Your health care team will teach you how to care for yourself and your baby.  You and your baby will stay in the same room (rooming in) during your hospital stay. This will encourage early bonding and successful breastfeeding.  You may continue to receive fluids and medicines through an IV.  Your uterus will be checked and massaged regularly (fundal massage).  You will have some soreness and pain in your abdomen, vagina, and the area of skin between your vaginal opening and your anus (perineum).  If an incision was made near your vagina (episiotomy) or if you had  some vaginal tearing during delivery, cold compresses may be placed on your episiotomy or your tear. This helps to reduce pain and swelling.  You may be given a squirt bottle to use instead of wiping when you go to the bathroom. To use the squirt bottle, follow these steps: ? Before you urinate, fill the squirt bottle with warm water. Do not use hot water. ? After you urinate, while you are sitting on the toilet, use the squirt bottle to rinse the area around your urethra and vaginal opening. This rinses away any urine and blood. ? Fill the squirt bottle with clean water every time you use the bathroom.  It is normal to have vaginal bleeding after delivery. Wear a sanitary pad for vaginal bleeding and discharge. Summary  Vaginal delivery means that you will give birth by pushing your baby out of your birth canal (vagina).  Your health care provider may monitor your contractions (uterine monitoring) and your baby's heart rate (fetal monitoring).  Your health care provider may perform a physical exam.  Normal labor and delivery is divided into three stages.  After labor is over, you and your baby will be monitored closely until you are ready to go home. This information is not intended to replace advice given to you by your health  care provider. Make sure you discuss any questions you have with your health care provider. Document Released: 09/25/2008 Document Revised: 01/21/2018 Document Reviewed: 01/21/2018 Elsevier Patient Education  2020 [employer] Third Trimester of Pregnancy  The third trimester is from week 28 through week 40 (months 7 through 9). This trimester is when your unborn baby (fetus) is growing very fast. At the end of the ninth month, the unborn baby is about 20 inches in length. It weighs about 6-10 pounds. Follow these instructions at home: Medicines  Take over-the-counter and prescription medicines only as told by your doctor. Some medicines are safe and some medicines are not safe during pregnancy.  Take a prenatal vitamin that contains at least 600 micrograms (mcg) of folic acid.  If you have trouble pooping (constipation), take medicine that will make your stool soft (stool softener) if your doctor approves. Eating and drinking   Eat regular, healthy meals.  Avoid raw meat and uncooked cheese.  If you get low calcium from the food you eat, talk to your doctor about taking a daily calcium supplement.  Eat four or five small meals rather than three large meals a day.  Avoid foods that are high in fat and sugars, such as fried and sweet foods.  To prevent constipation: ? Eat foods that are high in fiber, like fresh fruits and vegetables, whole grains, and beans. ? Drink enough fluids to keep your pee (urine) clear or pale yellow. Activity  Exercise only as told by your doctor. Stop exercising if you start to have cramps.  Avoid heavy lifting, wear low heels, and sit up straight.  Do not exercise if it is too hot, too humid, or if you are in a place of great height (high altitude).  You may continue to have sex unless your doctor tells you not to. Relieving pain and discomfort  Wear a good support bra if your breasts are tender.  Take frequent breaks and rest with your legs  raised if you have leg cramps or low back pain.  Take warm water baths (sitz baths) to soothe pain or discomfort caused by hemorrhoids. Use hemorrhoid cream if your doctor approves.  If you develop  puffy, bulging veins (varicose veins) in your legs: ? Wear support hose or compression stockings as told by your doctor. ? Raise (elevate) your feet for 15 minutes, 3-4 times a day. ? Limit salt in your food. Safety  Wear your seat belt when driving.  Make a list of emergency phone numbers, including numbers for family, friends, the hospital, and police and fire departments. Preparing for your baby's arrival To prepare for the arrival of your baby:  Take prenatal classes.  Practice driving to the hospital.  Visit the hospital and tour the maternity area.  Talk to your work about taking leave once the baby comes.  Pack your hospital bag.  Prepare the baby's room.  Go to your doctor visits.  Buy a rear-facing car seat. Learn how to install it in your car. General instructions  Do not use hot tubs, steam rooms, or saunas.  Do not use any products that contain nicotine or tobacco, such as cigarettes and e-cigarettes. If you need help quitting, ask your doctor.  Do not drink alcohol.  Do not douche or use tampons or scented sanitary pads.  Do not cross your legs for long periods of time.  Do not travel for long distances unless you must. Only do so if your doctor says it is okay.  Visit your dentist if you have not gone during your pregnancy. Use a soft toothbrush to brush your teeth. Be gentle when you floss.  Avoid cat litter boxes and soil used by cats. These carry germs that can cause birth defects in the baby and can cause a loss of your baby (miscarriage) or stillbirth.  Keep all your prenatal visits as told by your doctor. This is important. Contact a doctor if:  You are not sure if you are in labor or if your water has broken.  You are dizzy.  You have mild cramps  or pressure in your lower belly.  You have a nagging pain in your belly area.  You continue to feel sick to your stomach, you throw up, or you have watery poop.  You have bad smelling fluid coming from your vagina.  You have pain when you pee. Get help right away if:  You have a fever.  You are leaking fluid from your vagina.  You are spotting or bleeding from your vagina.  You have severe belly cramps or pain.  You lose or gain weight quickly.  You have trouble catching your breath and have chest pain.  You notice sudden or extreme puffiness (swelling) of your face, hands, ankles, feet, or legs.  You have not felt the baby move in over an hour.  You have severe headaches that do not go away with medicine.  You have trouble seeing.  You are leaking, or you are having a gush of fluid, from your vagina before you are 37 weeks.  You have regular belly spasms (contractions) before you are 37 weeks. Summary  The third trimester is from week 28 through week 40 (months 7 through 9). This time is when your unborn baby is growing very fast.  Follow your doctor's advice about medicine, food, and activity.  Get ready for the arrival of your baby by taking prenatal classes, getting all the baby items ready, preparing the baby's room, and visiting your doctor to be checked.  Get help right away if you are bleeding from your vagina, or you have chest pain and trouble catching your breath, or if you have not felt your  baby move in over an hour. This information is not intended to replace advice given to you by your health care provider. Make sure you discuss any questions you have with your health care provider. Document Released: 03/13/2010 Document Revised: 04/09/2019 Document Reviewed: 01/22/2017 Elsevier Patient Education  2020 [employer] Inc[employer] WHAT OB PATIENTS CAN EXPECT   Confirmation of pregnancy and ultrasound ordered if medically indicated-[redacted] weeks gestation  New OB (NOB)  intake with nurse and New OB (NOB) labs- [redacted] weeks gestation  New OB (NOB) physical examination with provider- 11/[redacted] weeks gestation  Flu vaccine-[redacted] weeks gestation  Anatomy scan-[redacted] weeks gestation  Glucose tolerance test, blood work to test for anemia, T-dap vaccine-[redacted] weeks gestation  Vaginal swabs/cultures-STD/Group B strep-[redacted] weeks gestation  Appointments every 4 weeks until 28 weeks  Every 2 weeks from 28 weeks until 36 weeks  Weekly visits from 36 weeks until delivery

## 2019-08-13 NOTE — Progress Notes (Signed)
ROB-Doing well, no questions or concerns. Third trimester handouts and dental clearance letter provided. Anticipatory guidance regarding course of prenatal care. Reviewed red flag symptoms and when to call. RTC x 2 weeks for ROB or sooner if needed.

## 2019-08-19 ENCOUNTER — Encounter: Payer: Self-pay | Admitting: Certified Nurse Midwife

## 2019-08-25 ENCOUNTER — Telehealth: Payer: Self-pay

## 2019-08-25 NOTE — Telephone Encounter (Signed)
Coronavirus (COVID-19) Are you at risk?  Are you at risk for the Coronavirus (COVID-19)?  To be considered HIGH RISK for Coronavirus (COVID-19), you have to meet the following criteria:  . Traveled to [place], [place], [place], [place] or [place]; or in the [place] to [place], [place], [place], or [place]; and have fever, cough, and shortness of breath within the last 2 weeks of travel OR . Been in close contact with a person diagnosed with COVID-19 within the last 2 weeks and have fever, cough, and shortness of breath . IF YOU DO NOT MEET THESE CRITERIA, YOU ARE CONSIDERED LOW RISK FOR COVID-19.  What to do if you are HIGH RISK for COVID-19?  . If you are having a medical emergency, call 911. . Seek medical care right away. Before you go to a doctor's office, urgent care or emergency department, call ahead and tell them about your recent travel, contact with someone diagnosed with COVID-19, and your symptoms. You should receive instructions from your physician's office regarding next steps of care.  . When you arrive at healthcare provider, tell the healthcare staff immediately you have returned from visiting [place], [place], [place], [place] or [place]; or traveled in the [place] to [place], [place], [place], or [place]; in the last two weeks or you have been in close contact with a person diagnosed with COVID-19 in the last 2 weeks.   . Tell the health care staff about your symptoms: fever, cough and shortness of breath. . After you have been seen by a medical provider, you will be either: o Tested for (COVID-19) and discharged home on quarantine except to seek medical care if symptoms worsen, and asked to  - Stay home and avoid contact with others until you get your results (4-5 days)  - Avoid travel on public transportation if possible (such as bus, train, or airplane) or o Sent to the Emergency Department by EMS for evaluation, COVID-19 testing, and possible  admission depending on your condition and test results.  What to do if you are LOW RISK for COVID-19?  Reduce your risk of any infection by using the same precautions used for avoiding the common cold or flu:  . Wash your hands often with soap and warm water for at least 20 seconds.  If soap and water are not readily available, use an alcohol-based hand sanitizer with at least 60% alcohol.  . If coughing or sneezing, cover your mouth and nose by coughing or sneezing into the elbow areas of your shirt or coat, into a tissue or into your sleeve (not your hands). . Avoid shaking hands with others and consider head nods or verbal greetings only. . Avoid touching your eyes, nose, or mouth with unwashed hands.  . Avoid close contact with people who are [place]. . Avoid places or events with large numbers of people in one location, like concerts or sporting events. . Carefully consider travel plans you have or are making. . If you are planning any travel outside or inside the [place], visit the CDC's Travelers' Health webpage for the latest health notices. . If you have some symptoms but not all symptoms, continue to monitor at home and seek medical attention if your symptoms worsen. . If you are having a medical emergency, call 911.  08/25/19 SCREENING NEG SLS ADDITIONAL HEALTHCARE OPTIONS FOR PATIENTS  [facility] / e-Visit: [email or web address]         [facility]: [phone]    [facility]: [phone]                   [facility]: [phone]  

## 2019-08-26 ENCOUNTER — Ambulatory Visit (INDEPENDENT_AMBULATORY_CARE_PROVIDER_SITE_OTHER): Payer: BC Managed Care – PPO | Admitting: Certified Nurse Midwife

## 2019-08-26 ENCOUNTER — Other Ambulatory Visit: Payer: Self-pay

## 2019-08-26 VITALS — BP 116/78 | HR 113 | Wt 223.5 lb

## 2019-08-26 DIAGNOSIS — Z3402 Encounter for supervision of normal first pregnancy, second trimester: Secondary | ICD-10-CM

## 2019-08-26 LAB — POCT URINALYSIS DIPSTICK OB
Bilirubin, UA: NEGATIVE
Blood, UA: NEGATIVE
Glucose, UA: NEGATIVE
Ketones, UA: NEGATIVE
Leukocytes, UA: NEGATIVE
Nitrite, UA: NEGATIVE
POC,PROTEIN,UA: NEGATIVE
Spec Grav, UA: 1.01 (ref 1.010–1.025)
Urobilinogen, UA: 0.2 E.U./dL
pH, UA: 5 (ref 5.0–8.0)

## 2019-08-26 NOTE — Addendum Note (Signed)
Addended by: Raliegh Ip on: 08/26/2019 02:56 PM   Modules accepted: Orders

## 2019-08-26 NOTE — Patient Instructions (Signed)
How a Baby Grows During Pregnancy  Pregnancy begins when a female's sperm enters a female's egg (fertilization). Fertilization usually happens in one of the tubes (fallopian tubes) that connect the ovaries to the womb (uterus). The fertilized egg moves down the fallopian tube to the uterus. Once it reaches the uterus, it implants into the lining of the uterus and begins to grow. For the first 10 weeks, the fertilized egg is called an embryo. After 10 weeks, it is called a fetus. As the fetus continues to grow, it receives oxygen and nutrients through tissue (placenta) that grows to support the developing baby. The placenta is the life support system for the baby. It provides oxygen and nutrition and removes waste. Learning as much as you can about your pregnancy and how your baby is developing can help you enjoy the experience. It can also make you aware of when there might be a problem and when to ask questions. How long does a typical pregnancy last? A pregnancy usually lasts 280 days, or about 40 weeks. Pregnancy is divided into three periods of growth, also called trimesters:  First trimester: 0-12 weeks.  Second trimester: 13-27 weeks.  Third trimester: 28-40 weeks. The day when your baby is ready to be born (full term) is your estimated date of delivery. How does my baby develop month by month? First month  The fertilized egg attaches to the inside of the uterus.  Some cells will form the placenta. Others will form the fetus.  The arms, legs, brain, spinal cord, lungs, and heart begin to develop.  At the end of the first month, the heart begins to beat. Second month  The bones, inner ear, eyelids, hands, and feet form.  The genitals develop.  By the end of 8 weeks, all major organs are developing. Third month  All of the internal organs are forming.  Teeth develop below the gums.  Bones and muscles begin to grow. The spine can flex.  The skin is transparent.  Fingernails  and toenails begin to form.  Arms and legs continue to grow longer, and hands and feet develop.  The fetus is about 3 inches (7.6 cm) long. Fourth month  The placenta is completely formed.  The external sex organs, neck, outer ear, eyebrows, eyelids, and fingernails are formed.  The fetus can hear, swallow, and move its arms and legs.  The kidneys begin to produce urine.  The skin is covered with a white, waxy coating (vernix) and very fine hair (lanugo). Fifth month  The fetus moves around more and can be felt for the first time (quickening).  The fetus starts to sleep and wake up and may begin to suck its finger.  The nails grow to the end of the fingers.  The organ in the digestive system that makes bile (gallbladder) functions and helps to digest nutrients.  If your baby is a girl, eggs are present in her ovaries. If your baby is a boy, testicles start to move down into his scrotum. Sixth month  The lungs are formed.  The eyes open. The brain continues to develop.  Your baby has fingerprints and toe prints. Your baby's hair grows thicker.  At the end of the second trimester, the fetus is about 9 inches (22.9 cm) long. Seventh month  The fetus kicks and stretches.  The eyes are developed enough to sense changes in light.  The hands can make a grasping motion.  The fetus responds to sound. Eighth month  All   organs and body systems are fully developed and functioning.  Bones harden, and taste buds develop. The fetus may hiccup.  Certain areas of the brain are still developing. The skull remains soft. Ninth month  The fetus gains about  lb (0.23 kg) each week.  The lungs are fully developed.  Patterns of sleep develop.  The fetus's head typically moves into a head-down position (vertex) in the uterus to prepare for birth.  The fetus weighs 6-9 lb (2.72-4.08 kg) and is 19-20 inches (48.26-50.8 cm) long. What can I do to have a healthy pregnancy and help  my baby develop? General instructions  Take prenatal vitamins as directed by your health care provider. These include vitamins such as folic acid, iron, calcium, and vitamin D. They are important for healthy development.  Take medicines only as directed by your health care provider. Read labels and ask a pharmacist or your health care provider whether over-the-counter medicines, supplements, and prescription drugs are safe to take during pregnancy.  Keep all follow-up visits as directed by your health care provider. This is important. Follow-up visits include prenatal care and screening tests. How do I know if my baby is developing well? At each prenatal visit, your health care provider will do several different tests to check on your health and keep track of your baby's development. These include:  Fundal height and position. ? Your health care provider will measure your growing belly from your pubic bone to the top of the uterus using a tape measure. ? Your health care provider will also feel your belly to determine your baby's position.  Heartbeat. ? An ultrasound in the first trimester can confirm pregnancy and show a heartbeat, depending on how far along you are. ? Your health care provider will check your baby's heart rate at every prenatal visit.  Second trimester ultrasound. ? This ultrasound checks your baby's development. It also may show your baby's gender. What should I do if I have concerns about my baby's development? Always talk with your health care provider about any concerns that you may have about your pregnancy and your baby. Summary  A pregnancy usually lasts 280 days, or about 40 weeks. Pregnancy is divided into three periods of growth, also called trimesters.  Your health care provider will monitor your baby's growth and development throughout your pregnancy.  Follow your health care provider's recommendations about taking prenatal vitamins and medicines during  your pregnancy.  Talk with your health care provider if you have any concerns about your pregnancy or your developing baby. This information is not intended to replace advice given to you by your health care provider. Make sure you discuss any questions you have with your health care provider. Document Released: 06/04/2008 Document Revised: 04/09/2019 Document Reviewed: 10/30/2017 Elsevier Patient Education  2020 [employer] Inc.  

## 2019-08-26 NOTE — Progress Notes (Signed)
ROB doing well. Feels good movement. Preterm labor precautions reviewed. Pt had questions about birth plan. Questions answered. Discussed weight gain recommendations. Encouraged exercise and dietary changes. Follow up 2 wk with Melody .   Philip Aspen, CNM

## 2019-09-09 ENCOUNTER — Other Ambulatory Visit: Payer: Self-pay

## 2019-09-09 ENCOUNTER — Ambulatory Visit (INDEPENDENT_AMBULATORY_CARE_PROVIDER_SITE_OTHER): Payer: BC Managed Care – PPO | Admitting: Obstetrics and Gynecology

## 2019-09-09 VITALS — BP 106/68 | HR 103 | Wt 224.9 lb

## 2019-09-09 DIAGNOSIS — Z3493 Encounter for supervision of normal pregnancy, unspecified, third trimester: Secondary | ICD-10-CM

## 2019-09-09 DIAGNOSIS — F17200 Nicotine dependence, unspecified, uncomplicated: Secondary | ICD-10-CM

## 2019-09-09 LAB — POCT URINALYSIS DIPSTICK OB
Bilirubin, UA: NEGATIVE
Blood, UA: NEGATIVE
Glucose, UA: NEGATIVE
Ketones, UA: NEGATIVE
Leukocytes, UA: NEGATIVE
Nitrite, UA: NEGATIVE
POC,PROTEIN,UA: NEGATIVE
Spec Grav, UA: 1.01 (ref 1.010–1.025)
Urobilinogen, UA: 0.2 E.U./dL
pH, UA: 7.5 (ref 5.0–8.0)

## 2019-09-09 MED ORDER — PRENATAL MULTIVITAMIN CH: 3.0000 | ORAL_TABLET | Freq: Every day | ORAL | 4 refills | Status: DC

## 2019-09-09 MED ORDER — ALBUTEROL SULFATE HFA 108 (90 BASE) MCG/ACT IN AERS: 2.0000 | INHALATION_SPRAY | Freq: Four times a day (QID) | RESPIRATORY_TRACT | 2 refills | Status: DC | PRN

## 2019-09-09 NOTE — Progress Notes (Signed)
ROB- pt is having some pelvic pressure,

## 2019-09-09 NOTE — Progress Notes (Signed)
ROB- doing well, ready,set baby video reviewed today in office. Cultures next visit. Discussed [place] and [email or web address] for labor support, down to smoking 1-2 cig a day.  Declined Flu vaccine.

## 2019-09-24 ENCOUNTER — Ambulatory Visit (INDEPENDENT_AMBULATORY_CARE_PROVIDER_SITE_OTHER): Payer: BC Managed Care – PPO | Admitting: Certified Nurse Midwife

## 2019-09-24 ENCOUNTER — Other Ambulatory Visit: Payer: Self-pay

## 2019-09-24 VITALS — BP 103/69 | HR 90 | Wt 227.3 lb

## 2019-09-24 DIAGNOSIS — O2600 Excessive weight gain in pregnancy, unspecified trimester: Secondary | ICD-10-CM | POA: Insufficient documentation

## 2019-09-24 DIAGNOSIS — O99210 Obesity complicating pregnancy, unspecified trimester: Secondary | ICD-10-CM | POA: Insufficient documentation

## 2019-09-24 DIAGNOSIS — Z3A36 36 weeks gestation of pregnancy: Secondary | ICD-10-CM

## 2019-09-24 DIAGNOSIS — O2603 Excessive weight gain in pregnancy, third trimester: Secondary | ICD-10-CM

## 2019-09-24 DIAGNOSIS — Z3685 Encounter for antenatal screening for Streptococcus B: Secondary | ICD-10-CM

## 2019-09-24 DIAGNOSIS — O99213 Obesity complicating pregnancy, third trimester: Secondary | ICD-10-CM

## 2019-09-24 DIAGNOSIS — Z3493 Encounter for supervision of normal pregnancy, unspecified, third trimester: Secondary | ICD-10-CM

## 2019-09-24 DIAGNOSIS — Z113 Encounter for screening for infections with a predominantly sexual mode of transmission: Secondary | ICD-10-CM

## 2019-09-24 LAB — POCT URINALYSIS DIPSTICK OB
Bilirubin, UA: NEGATIVE
Blood, UA: NEGATIVE
Glucose, UA: NEGATIVE
Ketones, UA: NEGATIVE
Leukocytes, UA: NEGATIVE
Nitrite, UA: NEGATIVE
Spec Grav, UA: 1.01
Urobilinogen, UA: 0.2 U/dL
pH, UA: 6.5

## 2019-09-24 LAB — OB RESULTS CONSOLE GC/CHLAMYDIA: Gonorrhea: NEGATIVE

## 2019-09-24 NOTE — Patient Instructions (Signed)
Vaginal Delivery  Vaginal delivery means that you give birth by pushing your baby out of your birth canal (vagina). A team of health care providers will help you before, during, and after vaginal delivery. Birth experiences are unique for every woman and every pregnancy, and birth experiences vary depending on where you choose to give birth. What happens when I arrive at the birth center or hospital? Once you are in labor and have been admitted into the hospital or birth center, your health care provider may:  Review your pregnancy history and any concerns that you have.  Insert an IV into one of your veins. This may be used to give you fluids and medicines.  Check your blood pressure, pulse, temperature, and heart rate (vital signs).  Check whether your bag of water (amniotic sac) has broken (ruptured).  Talk with you about your birth plan and discuss pain control options. Monitoring Your health care provider may monitor your contractions (uterine monitoring) and your baby's heart rate (fetal monitoring). You may need to be monitored:  Often, but not continuously (intermittently).  All the time or for long periods at a time (continuously). Continuous monitoring may be needed if: ? You are taking certain medicines, such as medicine to relieve pain or make your contractions stronger. ? You have pregnancy or labor complications. Monitoring may be done by:  Placing a special stethoscope or a handheld monitoring device on your abdomen to check your baby's heartbeat and to check for contractions.  Placing monitors on your abdomen (external monitors) to record your baby's heartbeat and the frequency and length of contractions.  Placing monitors inside your uterus through your vagina (internal monitors) to record your baby's heartbeat and the frequency, length, and strength of your contractions. Depending on the type of monitor, it may remain in your uterus or on your baby's head until birth.   Telemetry. This is a type of continuous monitoring that can be done with external or internal monitors. Instead of having to stay in bed, you are able to move around during telemetry. Physical exam Your health care provider may perform frequent physical exams. This may include:  Checking how and where your baby is positioned in your uterus.  Checking your cervix to determine: ? Whether it is thinning out (effacing). ? Whether it is opening up (dilating). What happens during labor and delivery?  Normal labor and delivery is divided into the following three stages: Stage 1  This is the longest stage of labor.  This stage can last for hours or days.  Throughout this stage, you will feel contractions. Contractions generally feel mild, infrequent, and irregular at first. They get stronger, more frequent (about every 2-3 minutes), and more regular as you move through this stage.  This stage ends when your cervix is completely dilated to 4 inches (10 cm) and completely effaced. Stage 2  This stage starts once your cervix is completely effaced and dilated and lasts until the delivery of your baby.  This stage may last from 20 minutes to 2 hours.  This is the stage where you will feel an urge to push your baby out of your vagina.  You may feel stretching and burning pain, especially when the widest part of your baby's head passes through the vaginal opening (crowning).  Once your baby is delivered, the umbilical cord will be clamped and cut. This usually occurs after waiting a period of 1-2 minutes after delivery.  Your baby will be placed on your bare chest (  skin-to-skin contact) in an upright position and covered with a warm blanket. Watch your baby for feeding cues, like rooting or sucking, and help the baby to your breast for his or her first feeding. Stage 3  This stage starts immediately after the birth of your baby and ends after you deliver the placenta.  This stage may take  anywhere from 5 to 30 minutes.  After your baby has been delivered, you will feel contractions as your body expels the placenta and your uterus contracts to control bleeding. What can I expect after labor and delivery?  After labor is over, you and your baby will be monitored closely until you are ready to go home to ensure that you are both healthy. Your health care team will teach you how to care for yourself and your baby.  You and your baby will stay in the same room (rooming in) during your hospital stay. This will encourage early bonding and successful breastfeeding.  You may continue to receive fluids and medicines through an IV.  Your uterus will be checked and massaged regularly (fundal massage).  You will have some soreness and pain in your abdomen, vagina, and the area of skin between your vaginal opening and your anus (perineum).  If an incision was made near your vagina (episiotomy) or if you had some vaginal tearing during delivery, cold compresses may be placed on your episiotomy or your tear. This helps to reduce pain and swelling.  You may be given a squirt bottle to use instead of wiping when you go to the bathroom. To use the squirt bottle, follow these steps: ? Before you urinate, fill the squirt bottle with warm water. Do not use hot water. ? After you urinate, while you are sitting on the toilet, use the squirt bottle to rinse the area around your urethra and vaginal opening. This rinses away any urine and blood. ? Fill the squirt bottle with clean water every time you use the bathroom.  It is normal to have vaginal bleeding after delivery. Wear a sanitary pad for vaginal bleeding and discharge. Summary  Vaginal delivery means that you will give birth by pushing your baby out of your birth canal (vagina).  Your health care provider may monitor your contractions (uterine monitoring) and your baby's heart rate (fetal monitoring).  Your health care provider may perform  a physical exam.  Normal labor and delivery is divided into three stages.  After labor is over, you and your baby will be monitored closely until you are ready to go home. This information is not intended to replace advice given to you by your health care provider. Make sure you discuss any questions you have with your health care provider. Document Released: 09/25/2008 Document Revised: 01/21/2018 Document Reviewed: 01/21/2018 Elsevier Patient Education  2020 [place].   Group B Streptococcus Test During Pregnancy Why am I having this test? Routine testing, also called screening, for group B streptococcus (GBS) is recommended for all pregnant women between the 36th and 37th week of pregnancy. GBS is a type of bacteria that can be passed from mother to baby during childbirth. Screening will help guide whether or not you will need treatment during labor and delivery to prevent complications such as:  An infection in your uterus during labor.  An infection in your uterus after delivery.  A serious infection in your baby after delivery, such as pneumonia, meningitis, or sepsis. GBS screening is not often done before 36 weeks of pregnancy unless  you go into labor prematurely. What happens if I have group B streptococcus? If testing shows that you have GBS, your health care provider will recommend treatment with IV antibiotics during labor and delivery. This treatment significantly decreases the risk of complications for you and your baby. If you have a planned C-section and you have GBS, you may not need to be treated with antibiotics because GBS is usually passed to babies after labor starts and your water breaks. If you are in labor or your water breaks before your C-section, it is possible for GBS to get into your uterus and be passed to your baby, so you might need treatment. Is there a chance I may not need to be tested? You may not need to be tested for GBS if:  You have a urine test  that shows GBS before 36 to 37 weeks.  You had a baby with GBS infection after a previous delivery. In these cases, you will automatically be treated for GBS during labor and delivery. What is being tested? This test is done to check if you have group B streptococcus in your vagina or rectum. What kind of sample is taken? To collect samples for this test, your health care provider will swab your vagina and rectum with a cotton swab. The sample is then sent to the lab to see if GBS is present. What happens during the test?   You will remove your clothing from the waist down.  You will lie down on an exam table in the same position as you would for a pelvic exam.  Your health care provider will swab your vagina and rectum to collect samples for a culture test.  You will be able to go home after the test and do all your usual activities. How are the results reported? The test results are reported as positive or negative. What do the results mean?  A positive test means you are at risk for passing GBS to your baby during labor and delivery. Your health care provider will recommend that you are treated with an IV antibiotic during labor and delivery.  A negative test means you are at very low risk of passing GBS to your baby. There is still a low risk of passing GBS to your baby because sometimes test results may report that you do not have a condition when you do (false-negative result) or there is a chance that you may become infected with GBS after the test is done. You most likely will not need to be treated with an antibiotic during labor and delivery. Talk with your health care provider about what your results mean. Questions to ask your health care provider Ask your health care provider, or the department that is doing the test:  When will my results be ready?  How will I get my results?  What are my treatment options? Summary  Routine testing (screening) for group B  streptococcus (GBS) is recommended for all pregnant women between the 36th and 37th week of pregnancy.  GBS is a type of bacteria that can be passed from mother to baby during childbirth.  If testing shows that you have GBS, your health care provider will recommend that you are treated with IV antibiotics during labor and delivery. This treatment almost always prevents infection in newborns. This information is not intended to replace advice given to you by your health care provider. Make sure you discuss any questions you have with your health care provider. Document Released:  01/14/2019 Document Revised: 04/09/2019 Document Reviewed: 01/14/2019 Elsevier Patient Education  2020 [employer]   Fetal Movement Counts Patient Name: ________________________________________________ Patient Due Date: ____________________ What is a fetal movement count?  A fetal movement count is the number of times that you feel your baby move during a certain amount of time. This may also be called a fetal kick count. A fetal movement count is recommended for every pregnant woman. You may be asked to start counting fetal movements as early as week 28 of your pregnancy. Pay attention to when your baby is most active. You may notice your baby's sleep and wake cycles. You may also notice things that make your baby move more. You should do a fetal movement count:  When your baby is normally most active.  At the same time each day. A good time to count movements is while you are resting, after having something to eat and drink. How do I count fetal movements? 1. Find a quiet, comfortable area. Sit, or lie down on your side. 2. Write down the date, the start time and stop time, and the number of movements that you felt between those two times. Take this information with you to your health care visits. 3. For 2 hours, count kicks, flutters, swishes, rolls, and jabs. You should feel at least 10 movements during 2 hours.  4. You may stop counting after you have felt 10 movements. 5. If you do not feel 10 movements in 2 hours, have something to eat and drink. Then, keep resting and counting for 1 hour. If you feel at least 4 movements during that hour, you may stop counting. Contact a health care provider if:  You feel fewer than 4 movements in 2 hours.  Your baby is not moving like he or she usually does. Date: ____________ Start time: ____________ Stop time: ____________ Movements: ____________ Date: ____________ Start time: ____________ Stop time: ____________ Movements: ____________ Date: ____________ Start time: ____________ Stop time: ____________ Movements: ____________ Date: ____________ Start time: ____________ Stop time: ____________ Movements: ____________ Date: ____________ Start time: ____________ Stop time: ____________ Movements: ____________ Date: ____________ Start time: ____________ Stop time: ____________ Movements: ____________ Date: ____________ Start time: ____________ Stop time: ____________ Movements: ____________ Date: ____________ Start time: ____________ Stop time: ____________ Movements: ____________ Date: ____________ Start time: ____________ Stop time: ____________ Movements: ____________ This information is not intended to replace advice given to you by your health care provider. Make sure you discuss any questions you have with your health care provider. Document Released: 01/16/2007 Document Revised: 01/06/2019 Document Reviewed: 01/26/2016 Elsevier Patient Education  2020 [employer]

## 2019-09-24 NOTE — Progress Notes (Signed)
ROB-No complaints.  

## 2019-09-24 NOTE — Progress Notes (Signed)
ROB-Doing well, no questions or concerns. Cultures collected, see orders. 36 week handouts provided. Body mass index is 42.95 kg/m. Education regarding excessive weight gain in pregnancy affected by obesity; advised induction of labor in the 39 week. Anticipatory guidance regarding course of prenatal care. Reviewed red flag symptoms, signs of labor, and when to call. RTC x 1 week for growth ultrasound and ROB or sooner if needed.

## 2019-09-26 LAB — STREP GP B NAA: Strep Gp B NAA: NEGATIVE

## 2019-09-29 LAB — GC/CHLAMYDIA PROBE AMP
Chlamydia trachomatis, NAA: NEGATIVE
Neisseria Gonorrhoeae by PCR: NEGATIVE

## 2019-09-30 ENCOUNTER — Ambulatory Visit (INDEPENDENT_AMBULATORY_CARE_PROVIDER_SITE_OTHER): Payer: BC Managed Care – PPO

## 2019-09-30 ENCOUNTER — Other Ambulatory Visit: Payer: Self-pay

## 2019-09-30 ENCOUNTER — Ambulatory Visit (INDEPENDENT_AMBULATORY_CARE_PROVIDER_SITE_OTHER): Payer: BC Managed Care – PPO | Admitting: Certified Nurse Midwife

## 2019-09-30 ENCOUNTER — Encounter: Payer: Self-pay | Admitting: Certified Nurse Midwife

## 2019-09-30 VITALS — BP 120/81 | HR 99 | Wt 229.1 lb

## 2019-09-30 DIAGNOSIS — Z3493 Encounter for supervision of normal pregnancy, unspecified, third trimester: Secondary | ICD-10-CM | POA: Diagnosis not present

## 2019-09-30 DIAGNOSIS — O99210 Obesity complicating pregnancy, unspecified trimester: Secondary | ICD-10-CM

## 2019-09-30 DIAGNOSIS — O2600 Excessive weight gain in pregnancy, unspecified trimester: Secondary | ICD-10-CM

## 2019-09-30 DIAGNOSIS — Z3A37 37 weeks gestation of pregnancy: Secondary | ICD-10-CM

## 2019-09-30 LAB — POCT URINALYSIS DIPSTICK OB
Bilirubin, UA: NEGATIVE
Blood, UA: NEGATIVE
Glucose, UA: NEGATIVE
Ketones, UA: NEGATIVE
Leukocytes, UA: NEGATIVE
Nitrite, UA: NEGATIVE
POC,PROTEIN,UA: NEGATIVE
Spec Grav, UA: 1.015
Urobilinogen, UA: 0.2 U/dL
pH, UA: 7

## 2019-09-30 NOTE — Progress Notes (Addendum)
ROB doing well Feels good movement. Has irregular braxton hick contractions. PT has concerns regarding induction. States she really wants to have natural labor. Discussed recommendation for delivery by 40 wks due to BMI, reviewed risk/benefits. . Will revisit at next appointment. Discussed NST  ( elevated BMI). and ROB in on wk. U/s for growth /afi reviewed. ( See below). Work note today to start leave on the 11th. Follow up 1 wk.   Patient Name: Vanessa Lindsey DOB: July 21, 1996 MRN: [id number] ULTRASOUND REPORT  Location: [facility] Date of Service: 09/30/2019   Indications:growth/afi Findings:  Vanessa Lindsey intrauterine pregnancy is visualized with FHR at 151 BPM. Biometrics give an (U/S) Gestational age of [redacted]w[redacted]d and an (U/S) EDD of 11/03/2019; this correlates with the clinically established Estimated Date of Delivery: 10/20/19.  Fetal presentation is Cephalic.  Placenta: posterior. Grade: 3 AFI: 9.1cm  Growth percentile is 15. EFW: 2558g ( 5 lbs 10 oz)   Impression: 1. [redacted]w[redacted]d Viable Singleton Intrauterine pregnancy previously established criteria. 2. Growth is 15 %ile.  AFI is 9.1 cm.   Recommendations: 1.Clinical correlation with the patient's History and Physical Exam.   Vanessa  M. [place], CNM

## 2019-09-30 NOTE — Patient Instructions (Signed)
Braxton Hicks Contractions Contractions of the uterus can occur throughout pregnancy, but they are not always a sign that you are in labor. You may have practice contractions called Braxton Hicks contractions. These false labor contractions are sometimes confused with true labor. What are Braxton Hicks contractions? Braxton Hicks contractions are tightening movements that occur in the muscles of the uterus before labor. Unlike true labor contractions, these contractions do not result in opening (dilation) and thinning of the cervix. Toward the end of pregnancy (32-34 weeks), Braxton Hicks contractions can happen more often and may become stronger. These contractions are sometimes difficult to tell apart from true labor because they can be very uncomfortable. You should not feel embarrassed if you go to the hospital with false labor. Sometimes, the only way to tell if you are in true labor is for your health care provider to look for changes in the cervix. The health care provider will do a physical exam and may monitor your contractions. If you are not in true labor, the exam should show that your cervix is not dilating and your water has not broken. If there are no other health problems associated with your pregnancy, it is completely safe for you to be sent home with false labor. You may continue to have Braxton Hicks contractions until you go into true labor. How to tell the difference between true labor and false labor True labor  Contractions last 30-70 seconds.  Contractions become very regular.  Discomfort is usually felt in the top of the uterus, and it spreads to the lower abdomen and low back.  Contractions do not go away with walking.  Contractions usually become more intense and increase in frequency.  The cervix dilates and gets thinner. False labor  Contractions are usually shorter and not as strong as true labor contractions.  Contractions are usually irregular.  Contractions  are often felt in the front of the lower abdomen and in the groin.  Contractions may go away when you walk around or change positions while lying down.  Contractions get weaker and are shorter-lasting as time goes on.  The cervix usually does not dilate or become thin. Follow these instructions at home:   Take over-the-counter and prescription medicines only as told by your health care provider.  Keep up with your usual exercises and follow other instructions from your health care provider.  Eat and drink lightly if you think you are going into labor.  If Braxton Hicks contractions are making you uncomfortable: ? Change your position from lying down or resting to walking, or change from walking to resting. ? Sit and rest in a tub of warm water. ? Drink enough fluid to keep your urine pale yellow. Dehydration may cause these contractions. ? Do slow and deep breathing several times an hour.  Keep all follow-up prenatal visits as told by your health care provider. This is important. Contact a health care provider if:  You have a fever.  You have continuous pain in your abdomen. Get help right away if:  Your contractions become stronger, more regular, and closer together.  You have fluid leaking or gushing from your vagina.  You pass blood-tinged mucus (bloody show).  You have bleeding from your vagina.  You have low back pain that you never had before.  You feel your baby's head pushing down and causing pelvic pressure.  Your baby is not moving inside you as much as it used to. Summary  Contractions that occur before labor are   called Braxton Hicks contractions, false labor, or practice contractions.  Braxton Hicks contractions are usually shorter, weaker, farther apart, and less regular than true labor contractions. True labor contractions usually become progressively stronger and regular, and they become more frequent.  Manage discomfort from Braxton Hicks contractions  by changing position, resting in a warm bath, drinking plenty of water, or practicing deep breathing. This information is not intended to replace advice given to you by your health care provider. Make sure you discuss any questions you have with your health care provider. Document Released: 05/02/2017 Document Revised: 11/29/2017 Document Reviewed: 05/02/2017 Elsevier Patient Education  2020 [employer]  

## 2019-10-08 ENCOUNTER — Other Ambulatory Visit: Payer: Self-pay

## 2019-10-08 ENCOUNTER — Other Ambulatory Visit: Payer: BC Managed Care – PPO

## 2019-10-08 ENCOUNTER — Ambulatory Visit (INDEPENDENT_AMBULATORY_CARE_PROVIDER_SITE_OTHER): Payer: BC Managed Care – PPO | Admitting: Certified Nurse Midwife

## 2019-10-08 VITALS — BP 94/61 | HR 85 | Wt 229.9 lb

## 2019-10-08 DIAGNOSIS — R252 Cramp and spasm: Secondary | ICD-10-CM

## 2019-10-08 DIAGNOSIS — Z3493 Encounter for supervision of normal pregnancy, unspecified, third trimester: Secondary | ICD-10-CM

## 2019-10-08 DIAGNOSIS — Z3A38 38 weeks gestation of pregnancy: Secondary | ICD-10-CM

## 2019-10-08 DIAGNOSIS — O99891 Other specified diseases and conditions complicating pregnancy: Secondary | ICD-10-CM

## 2019-10-08 DIAGNOSIS — J019 Acute sinusitis, unspecified: Secondary | ICD-10-CM

## 2019-10-08 DIAGNOSIS — O43893 Other placental disorders, third trimester: Secondary | ICD-10-CM | POA: Diagnosis not present

## 2019-10-08 DIAGNOSIS — O99213 Obesity complicating pregnancy, third trimester: Secondary | ICD-10-CM

## 2019-10-08 LAB — POCT URINALYSIS DIPSTICK OB
Bilirubin, UA: NEGATIVE
Blood, UA: NEGATIVE
Glucose, UA: NEGATIVE
Ketones, UA: NEGATIVE
Nitrite, UA: NEGATIVE
POC,PROTEIN,UA: NEGATIVE
Spec Grav, UA: 1.01 (ref 1.010–1.025)
Urobilinogen, UA: 0.2 E.U./dL
pH, UA: 6.5 (ref 5.0–8.0)

## 2019-10-08 MED ORDER — MAGNESIUM OXIDE -MG SUPPLEMENT 400 (240 MG) MG PO TABS: 1.0000 | ORAL_TABLET | Freq: Two times a day (BID) | ORAL | 1 refills | Status: DC

## 2019-10-08 MED ORDER — AMOXICILLIN 500 MG PO CAPS: 500.0000 mg | ORAL_CAPSULE | Freq: Three times a day (TID) | ORAL | 2 refills | Status: DC

## 2019-10-08 NOTE — Patient Instructions (Signed)
Labor Induction  Labor induction is when steps are taken to cause a pregnant woman to begin the labor process. Most women go into labor on their own between 37 weeks and 42 weeks of pregnancy. When this does not happen or when there is a medical need for labor to begin, steps may be taken to induce labor. Labor induction causes a pregnant woman's uterus to contract. It also causes the cervix to soften (ripen), open (dilate), and thin out (efface). Usually, labor is not induced before 39 weeks of pregnancy unless there is a medical reason to do so. Your health care provider will determine if labor induction is needed. Before inducing labor, your health care provider will consider a number of factors, including:  Your medical condition and your baby's.  How many weeks along you are in your pregnancy.  How mature your baby's lungs are.  The condition of your cervix.  The position of your baby.  The size of your birth canal. What are some reasons for labor induction? Labor may be induced if:  Your health or your baby's health is at risk.  Your pregnancy is overdue by 1 week or more.  Your water breaks but labor does not start on its own.  There is a low amount of amniotic fluid around your baby. You may also choose (elect) to have labor induced at a certain time. Generally, elective labor induction is done no earlier than 39 weeks of pregnancy. What methods are used for labor induction? Methods used for labor induction include:  Prostaglandin medicine. This medicine starts contractions and causes the cervix to dilate and ripen. It can be taken by mouth (orally) or by being inserted into the vagina (suppository).  Inserting a small, thin tube (catheter) with a balloon into the vagina and then expanding the balloon with water to dilate the cervix.  Stripping the membranes. In this method, your health care provider gently separates amniotic sac tissue from the cervix. This causes the  cervix to stretch, which in turn causes the release of a hormone called progesterone. The hormone causes the uterus to contract. This procedure is often done during an office visit, after which you will be sent home to wait for contractions to begin.  Breaking the water. In this method, your health care provider uses a small instrument to make a small hole in the amniotic sac. This eventually causes the amniotic sac to break. Contractions should begin after a few hours.  Medicine to trigger or strengthen contractions. This medicine is given through an IV that is inserted into a vein in your arm. Except for membrane stripping, which can be done in a clinic, labor induction is done in the hospital so that you and your baby can be carefully monitored. How long does it take for labor to be induced? The length of time it takes to induce labor depends on how ready your body is for labor. Some inductions can take up to 2-3 days, while others may take less than a day. Induction may take longer if:  You are induced early in your pregnancy.  It is your first pregnancy.  Your cervix is not ready. What are some risks associated with labor induction? Some risks associated with labor induction include:  Changes in fetal heart rate, such as being too high, too low, or irregular (erratic).  Failed induction.  Infection in the mother or the baby.  Increased risk of having a cesarean delivery.  Fetal death.  Breaking off (abruption)   of the placenta from the uterus (rare).  Rupture of the uterus (very rare). When induction is needed for medical reasons, the benefits of induction generally outweigh the risks. What are some reasons for not inducing labor? Labor induction should not be done if:  Your baby does not tolerate contractions.  You have had previous surgeries on your uterus, such as a myomectomy, removal of fibroids, or a vertical scar from a previous cesarean delivery.  Your placenta lies  very low in your uterus and blocks the opening of the cervix (placenta previa).  Your baby is not in a head-down position.  The umbilical cord drops down into the birth canal in front of the baby.  There are unusual circumstances, such as the baby being very early (premature).  You have had more than 2 previous cesarean deliveries. Summary  Labor induction is when steps are taken to cause a pregnant woman to begin the labor process.  Labor induction causes a pregnant woman's uterus to contract. It also causes the cervix to ripen, dilate, and efface.  Labor is not induced before 39 weeks of pregnancy unless there is a medical reason to do so.  When induction is needed for medical reasons, the benefits of induction generally outweigh the risks. This information is not intended to replace advice given to you by your health care provider. Make sure you discuss any questions you have with your health care provider. Document Released: 05/08/2007 Document Revised: 12/20/2017 Document Reviewed: 01/30/2017 Elsevier Patient Education  2020 [employer] Leg Cramps Leg cramps occur when one or more muscles tighten and you have no control over this tightening (involuntary muscle contraction). Muscle cramps can develop in any muscle, but the most common place is in the calf muscles of the leg. Those cramps can occur during exercise or when you are at rest. Leg cramps are painful, and they may last for a few seconds to a few minutes. Cramps may return several times before they finally stop. Usually, leg cramps are not caused by a serious medical problem. In many cases, the cause is not known. Some common causes include:  Excessive physical effort (overexertion), such as during intense exercise.  Overuse from repetitive motions, or doing the same thing over and over.  Staying in a certain position for a long period of time.  Improper preparation, form, or technique while performing a sport or an  activity.  Dehydration.  Injury.  Side effects of certain medicines.  Abnormally low levels of minerals in your blood (electrolytes), especially potassium and calcium. This could result from: ? Pregnancy. ? Taking diuretic medicines. Follow these instructions at home: Eating and drinking  Drink enough fluid to keep your urine pale yellow. Staying hydrated may help prevent cramps.  Eat a healthy diet that includes plenty of nutrients to help your muscles function. A healthy diet includes fruits and vegetables, lean protein, whole grains, and low-fat or nonfat dairy products. Managing pain, stiffness, and swelling      Try massaging, stretching, and relaxing the affected muscle. Do this for several minutes at a time.  If directed, put ice on areas that are sore or painful after a cramp: ? Put ice in a plastic bag. ? Place a towel between your skin and the bag. ? Leave the ice on for 20 minutes, 2-3 times a day.  If directed, apply heat to muscles that are tense or tight. Do this before you exercise, or as often as told by your health care provider. Use  the heat source that your health care provider recommends, such as a moist heat pack or a heating pad. ? Place a towel between your skin and the heat source. ? Leave the heat on for 20-30 minutes. ? Remove the heat if your skin turns bright red. This is especially important if you are unable to feel pain, heat, or cold. You may have a greater risk of getting burned.  Try taking hot showers or baths to help relax tight muscles. General instructions  If you are having frequent leg cramps, avoid intense exercise for several days.  Take over-the-counter and prescription medicines only as told by your health care provider.  Keep all follow-up visits as told by your health care provider. This is important. Contact a health care provider if:  Your leg cramps get more severe or more frequent, or they do not improve over time.  Your  foot becomes cold, numb, or blue. Summary  Muscle cramps can develop in any muscle, but the most common place is in the calf muscles of the leg.  Leg cramps are painful, and they may last for a few seconds to a few minutes.  Usually, leg cramps are not caused by a serious medical problem. Often, the cause is not known.  Stay hydrated and take over-the-counter and prescription medicines only as told by your health care provider. This information is not intended to replace advice given to you by your health care provider. Make sure you discuss any questions you have with your health care provider. Document Released: 01/24/2005 Document Revised: 11/29/2017 Document Reviewed: 09/26/2017 Elsevier Patient Education  2020 [employer] Inc. Fetal Movement Counts Patient Name: ________________________________________________ Patient Due Date: ____________________ What is a fetal movement count?  A fetal movement count is the number of times that you feel your baby move during a certain amount of time. This may also be called a fetal kick count. A fetal movement count is recommended for every pregnant woman. You may be asked to start counting fetal movements as early as week 28 of your pregnancy. Pay attention to when your baby is most active. You may notice your baby's sleep and wake cycles. You may also notice things that make your baby move more. You should do a fetal movement count:  When your baby is normally most active.  At the same time each day. A good time to count movements is while you are resting, after having something to eat and drink. How do I count fetal movements? 1. Find a quiet, comfortable area. Sit, or lie down on your side. 2. Write down the date, the start time and stop time, and the number of movements that you felt between those two times. Take this information with you to your health care visits. 3. For 2 hours, count kicks, flutters, swishes, rolls, and jabs. You should feel  at least 10 movements during 2 hours. 4. You may stop counting after you have felt 10 movements. 5. If you do not feel 10 movements in 2 hours, have something to eat and drink. Then, keep resting and counting for 1 hour. If you feel at least 4 movements during that hour, you may stop counting. Contact a health care provider if:  You feel fewer than 4 movements in 2 hours.  Your baby is not moving like he or she usually does. Date: ____________ Start time: ____________ Stop time: ____________ Movements: ____________ Date: ____________ Start time: ____________ Stop time: ____________ Movements: ____________ Date: ____________ Start time: ____________ Stop  time: ____________ Movements: ____________ Date: ____________ Start time: ____________ Stop time: ____________ Movements: ____________ Date: ____________ Start time: ____________ Stop time: ____________ Movements: ____________ Date: ____________ Start time: ____________ Stop time: ____________ Movements: ____________ Date: ____________ Start time: ____________ Stop time: ____________ Movements: ____________ Date: ____________ Start time: ____________ Stop time: ____________ Movements: ____________ Date: ____________ Start time: ____________ Stop time: ____________ Movements: ____________ This information is not intended to replace advice given to you by your health care provider. Make sure you discuss any questions you have with your health care provider. Document Released: 01/16/2007 Document Revised: 01/06/2019 Document Reviewed: 01/26/2016 Elsevier Patient Education  2020 [employer].

## 2019-10-08 NOTE — Progress Notes (Signed)
ROB and NST-Reports nightly leg cramping and right ear pain for the last few days. Rx: Magnesium oxide and Amoxicillin, see orders. Discussed rationale for antenatal testing and induction of labor in the 39th week, pt verbalized understanding. Reviewed red flag symptoms and when to call. RTC x 1 week for NST and ROB or sooner if needed  NONSTRESS TEST INTERPRETATION  INDICATIONS: Grade 3 Placenta and Obesity  FHR baseline: 150 bpm RESULTS:Reactive COMMENTS: Irregular contractions   PLAN: 1. Continue fetal kick counts twice a day. 2. Continue antepartum testing as scheduled-weekly   Vanessa Lindsey, CNM [facility], [facility]

## 2019-10-08 NOTE — Progress Notes (Addendum)
NST and ROB today

## 2019-10-11 ENCOUNTER — Encounter: Payer: Self-pay | Admitting: Certified Nurse Midwife

## 2019-10-11 DIAGNOSIS — Z8759 Personal history of other complications of pregnancy, childbirth and the puerperium: Secondary | ICD-10-CM | POA: Insufficient documentation

## 2019-10-13 ENCOUNTER — Inpatient Hospital Stay: Payer: BC Managed Care – PPO | Admitting: Anesthesiology

## 2019-10-13 ENCOUNTER — Inpatient Hospital Stay
Admission: EM | Admit: 2019-10-13 | Discharge: 2019-10-15 | DRG: 807 | Disposition: A | Discharge status: Home / Self Care | Payer: BC Managed Care – PPO | Attending: Obstetrics and Gynecology | Admitting: Obstetrics and Gynecology

## 2019-10-13 ENCOUNTER — Other Ambulatory Visit: Payer: Self-pay

## 2019-10-13 DIAGNOSIS — O365930 Maternal care for other known or suspected poor fetal growth, third trimester, not applicable or unspecified: Secondary | ICD-10-CM | POA: Diagnosis present

## 2019-10-13 DIAGNOSIS — O4202 Full-term premature rupture of membranes, onset of labor within 24 hours of rupture: Secondary | ICD-10-CM | POA: Diagnosis not present

## 2019-10-13 DIAGNOSIS — D500 Iron deficiency anemia secondary to blood loss (chronic): Secondary | ICD-10-CM | POA: Diagnosis present

## 2019-10-13 DIAGNOSIS — Z3A39 39 weeks gestation of pregnancy: Secondary | ICD-10-CM

## 2019-10-13 DIAGNOSIS — O99334 Smoking (tobacco) complicating childbirth: Secondary | ICD-10-CM | POA: Diagnosis present

## 2019-10-13 DIAGNOSIS — O9952 Diseases of the respiratory system complicating childbirth: Secondary | ICD-10-CM | POA: Diagnosis present

## 2019-10-13 DIAGNOSIS — O99214 Obesity complicating childbirth: Secondary | ICD-10-CM | POA: Diagnosis present

## 2019-10-13 DIAGNOSIS — E669 Obesity, unspecified: Secondary | ICD-10-CM | POA: Diagnosis present

## 2019-10-13 DIAGNOSIS — Z20828 Contact with and (suspected) exposure to other viral communicable diseases: Secondary | ICD-10-CM | POA: Diagnosis present

## 2019-10-13 DIAGNOSIS — Z8759 Personal history of other complications of pregnancy, childbirth and the puerperium: Secondary | ICD-10-CM | POA: Diagnosis present

## 2019-10-13 DIAGNOSIS — F17210 Nicotine dependence, cigarettes, uncomplicated: Secondary | ICD-10-CM | POA: Diagnosis present

## 2019-10-13 DIAGNOSIS — J45909 Unspecified asthma, uncomplicated: Secondary | ICD-10-CM | POA: Diagnosis present

## 2019-10-13 DIAGNOSIS — O9902 Anemia complicating childbirth: Secondary | ICD-10-CM | POA: Diagnosis present

## 2019-10-13 DIAGNOSIS — Z3490 Encounter for supervision of normal pregnancy, unspecified, unspecified trimester: Secondary | ICD-10-CM

## 2019-10-13 DIAGNOSIS — O26893 Other specified pregnancy related conditions, third trimester: Secondary | ICD-10-CM | POA: Diagnosis present

## 2019-10-13 LAB — CBC
HCT: 33.3 % — ABNORMAL LOW (ref 36.0–46.0)
Hemoglobin: 10.6 g/dL — ABNORMAL LOW (ref 12.0–15.0)
MCH: 25.8 pg — ABNORMAL LOW (ref 26.0–34.0)
MCHC: 31.8 g/dL (ref 30.0–36.0)
MCV: 81 fL (ref 80.0–100.0)
Platelets: 357 10*3/uL (ref 150–400)
RBC: 4.11 MIL/uL (ref 3.87–5.11)
RDW: 14.9 % (ref 11.5–15.5)
WBC: 16.4 10*3/uL — ABNORMAL HIGH (ref 4.0–10.5)
nRBC: 0 % (ref 0.0–0.2)

## 2019-10-13 LAB — TYPE AND SCREEN
ABO/RH(D): O POS
Antibody Screen: NEGATIVE

## 2019-10-13 LAB — RUPTURE OF MEMBRANE (ROM)PLUS: Rom Plus: POSITIVE

## 2019-10-13 LAB — RPR: RPR Ser Ql: NONREACTIVE

## 2019-10-13 LAB — SARS CORONAVIRUS 2 BY RT PCR (HOSPITAL ORDER, PERFORMED IN ~~LOC~~ HOSPITAL LAB): SARS Coronavirus 2: NEGATIVE

## 2019-10-13 MED ORDER — FENTANYL CITRATE (PF) 100 MCG/2ML IJ SOLN
50.0000 ug | INTRAMUSCULAR | Status: DC | PRN
  Administered 2019-10-13: 11:00:00 50 ug via INTRAVENOUS
  Filled 2019-10-13: qty 2

## 2019-10-13 MED ORDER — COCONUT OIL OIL
1.0000 "application " | TOPICAL_OIL | Status: DC | PRN
  Filled 2019-10-13: qty 120

## 2019-10-13 MED ORDER — ALBUTEROL SULFATE (2.5 MG/3ML) 0.083% IN NEBU: 2.5000 mg | INHALATION_SOLUTION | Freq: Four times a day (QID) | RESPIRATORY_TRACT | Status: DC | PRN

## 2019-10-13 MED ORDER — FENTANYL 2.5 MCG/ML W/ROPIVACAINE 0.15% IN NS 100 ML EPIDURAL (ARMC): 12.0000 mL/h | EPIDURAL | Status: DC

## 2019-10-13 MED ORDER — LIDOCAINE HCL (PF) 1 % IJ SOLN
30.0000 mL | INTRAMUSCULAR | Status: DC | PRN
  Filled 2019-10-13: qty 30

## 2019-10-13 MED ORDER — ONDANSETRON HCL 4 MG PO TABS: 4.0000 mg | ORAL_TABLET | ORAL | Status: DC | PRN

## 2019-10-13 MED ORDER — WITCH HAZEL-GLYCERIN EX PADS: 1.0000 "application " | MEDICATED_PAD | CUTANEOUS | Status: DC | PRN

## 2019-10-13 MED ORDER — TERBUTALINE SULFATE 1 MG/ML IJ SOLN: 0.2500 mg | Freq: Once | INTRAMUSCULAR | Status: DC | PRN

## 2019-10-13 MED ORDER — PRENATAL MULTIVITAMIN CH
1.0000 | ORAL_TABLET | Freq: Every day | ORAL | Status: DC
  Administered 2019-10-14 – 2019-10-15 (×2): 1 via ORAL
  Filled 2019-10-13 (×2): qty 1

## 2019-10-13 MED ORDER — ONDANSETRON HCL 4 MG/2ML IJ SOLN: 4.0000 mg | INTRAMUSCULAR | Status: DC | PRN

## 2019-10-13 MED ORDER — FERROUS SULFATE 325 (65 FE) MG PO TABS
325.0000 mg | ORAL_TABLET | Freq: Two times a day (BID) | ORAL | Status: DC
  Administered 2019-10-14 – 2019-10-15 (×3): 325 mg via ORAL
  Filled 2019-10-13 (×3): qty 1

## 2019-10-13 MED ORDER — FENTANYL 2.5 MCG/ML W/ROPIVACAINE 0.15% IN NS 100 ML EPIDURAL (ARMC)
EPIDURAL | Status: AC
  Filled 2019-10-13: qty 100

## 2019-10-13 MED ORDER — EPHEDRINE 5 MG/ML INJ: 10.0000 mg | INTRAVENOUS | Status: DC | PRN

## 2019-10-13 MED ORDER — SOD CITRATE-CITRIC ACID 500-334 MG/5ML PO SOLN: 30.0000 mL | ORAL | Status: DC | PRN

## 2019-10-13 MED ORDER — SENNOSIDES-DOCUSATE SODIUM 8.6-50 MG PO TABS
2.0000 | ORAL_TABLET | ORAL | Status: DC
  Administered 2019-10-14 – 2019-10-15 (×2): 2 via ORAL
  Filled 2019-10-13 (×2): qty 2

## 2019-10-13 MED ORDER — BUTORPHANOL TARTRATE 1 MG/ML IJ SOLN
1.0000 mg | INTRAMUSCULAR | Status: DC | PRN
  Administered 2019-10-13: 13:00:00 2 mg via INTRAVENOUS
  Filled 2019-10-13: qty 2

## 2019-10-13 MED ORDER — DIBUCAINE (PERIANAL) 1 % EX OINT: 1.0000 "application " | TOPICAL_OINTMENT | CUTANEOUS | Status: DC | PRN

## 2019-10-13 MED ORDER — MISOPROSTOL 50MCG HALF TABLET
50.0000 ug | ORAL_TABLET | ORAL | Status: DC
  Administered 2019-10-13: 50 ug via ORAL
  Filled 2019-10-13 (×2): qty 1

## 2019-10-13 MED ORDER — IBUPROFEN 600 MG PO TABS
600.0000 mg | ORAL_TABLET | Freq: Four times a day (QID) | ORAL | Status: DC
  Administered 2019-10-13 – 2019-10-15 (×8): 600 mg via ORAL
  Filled 2019-10-13 (×8): qty 1

## 2019-10-13 MED ORDER — PHENYLEPHRINE 40 MCG/ML (10ML) SYRINGE FOR IV PUSH (FOR BLOOD PRESSURE SUPPORT): 80.0000 ug | PREFILLED_SYRINGE | INTRAVENOUS | Status: DC | PRN

## 2019-10-13 MED ORDER — ACETAMINOPHEN 325 MG PO TABS: 650.0000 mg | ORAL_TABLET | ORAL | Status: DC | PRN

## 2019-10-13 MED ORDER — LACTATED RINGERS IV SOLN: 500.0000 mL | INTRAVENOUS | Status: DC | PRN

## 2019-10-13 MED ORDER — DOCUSATE SODIUM 100 MG PO CAPS
100.0000 mg | ORAL_CAPSULE | Freq: Two times a day (BID) | ORAL | Status: DC
  Administered 2019-10-13 – 2019-10-15 (×4): 100 mg via ORAL
  Filled 2019-10-13 (×4): qty 1

## 2019-10-13 MED ORDER — MISOPROSTOL 200 MCG PO TABS
ORAL_TABLET | ORAL | Status: AC
  Filled 2019-10-13: qty 4

## 2019-10-13 MED ORDER — OXYTOCIN 40 UNITS IN NORMAL SALINE INFUSION - SIMPLE MED: 1.0000 m[IU]/min | INTRAVENOUS | Status: DC

## 2019-10-13 MED ORDER — DIPHENHYDRAMINE HCL 50 MG/ML IJ SOLN: 12.5000 mg | INTRAMUSCULAR | Status: DC | PRN

## 2019-10-13 MED ORDER — BENZOCAINE-MENTHOL 20-0.5 % EX AERO
1.0000 "application " | INHALATION_SPRAY | CUTANEOUS | Status: DC | PRN
  Administered 2019-10-15: 1 via TOPICAL
  Filled 2019-10-13: qty 56

## 2019-10-13 MED ORDER — OXYTOCIN 40 UNITS IN NORMAL SALINE INFUSION - SIMPLE MED
2.5000 [IU]/h | INTRAVENOUS | Status: DC
  Filled 2019-10-13: qty 1000

## 2019-10-13 MED ORDER — LACTATED RINGERS IV SOLN: 500.0000 mL | Freq: Once | INTRAVENOUS | Status: DC

## 2019-10-13 MED ORDER — DIPHENHYDRAMINE HCL 25 MG PO CAPS: 25.0000 mg | ORAL_CAPSULE | Freq: Four times a day (QID) | ORAL | Status: DC | PRN

## 2019-10-13 MED ORDER — SIMETHICONE 80 MG PO CHEW: 80.0000 mg | CHEWABLE_TABLET | ORAL | Status: DC | PRN

## 2019-10-13 MED ORDER — OXYTOCIN BOLUS FROM INFUSION
500.0000 mL | Freq: Once | INTRAVENOUS | Status: AC
  Administered 2019-10-13: 500 mL via INTRAVENOUS

## 2019-10-13 MED ORDER — LACTATED RINGERS IV SOLN
INTRAVENOUS | Status: DC
  Administered 2019-10-13: 07:00:00 via INTRAVENOUS

## 2019-10-13 MED ORDER — LACTATED RINGERS IV SOLN: INTRAVENOUS | Status: DC

## 2019-10-13 MED ORDER — ONDANSETRON HCL 4 MG/2ML IJ SOLN: 4.0000 mg | Freq: Four times a day (QID) | INTRAMUSCULAR | Status: DC | PRN

## 2019-10-13 NOTE — H&P (Signed)
Obstetric History and Physical  Niomi H Dolecki is a 23 y.o. G1P0000 with IUP at 105w0d presenting with SROM of clear fluid since 3am. Patient states she has been having  none contractions, none vaginal bleeding, ruptured, clear fluid membranes, with active fetal movement.    Prenatal Course Source of Care: [facility]  Pregnancy complications or risks:elevated BMI, smoker, asthma  Prenatal labs and studies: ABO, Rh: --/--/PENDING (10/13 1884) Antibody: PENDING (10/13 0618) Rubella: 1.44 (04/27 1455) RPR: Non Reactive (04/27 1455)  HBsAg: Negative (04/27 1455)  HIV: Non Reactive (04/27 1455)  GBS:--/Negative (09/24 0956) 1 hr Glucola  Elevated, with normal 3hrGTT Genetic screening low risk female Anatomy US normal  Past Medical History:  Diagnosis Date  . Allergy   . Asthma   . Chickenpox     History reviewed. No pertinent surgical history.  OB History  Gravida Para Term Preterm AB Living  1 0 0 0 0 0  SAB TAB Ectopic Multiple Live Births  0 0 0 0 0    # Outcome Date GA Lbr Len/2nd Weight Sex Delivery Anes PTL Lv  1 Current             Social History   Socioeconomic History  . Marital status: Single    Spouse name: Not on file  . Number of children: Not on file  . Years of education: Not on file  . Highest education level: Not on file  Occupational History  . Not on file  Social Needs  . Financial resource strain: Not on file  . Food insecurity    Worry: Not on file    Inability: Not on file  . Transportation needs    Medical: Not on file    Non-medical: Not on file  Tobacco Use  . Smoking status: Current Every Day Smoker    Packs/day: 0.50    Types: Cigarettes  . Smokeless tobacco: Never Used  Substance and Sexual Activity  . Alcohol use: Not Currently  . Drug use: No  . Sexual activity: Yes    Birth control/protection: None  Lifestyle  . Physical activity    Days per week: Not on file    Minutes per session: Not on file  . Stress: Not on file   Relationships  . Social Musician on phone: Not on file    Gets together: Not on file    Attends religious service: Not on file    Active member of club or organization: Not on file    Attends meetings of clubs or organizations: Not on file    Relationship status: Not on file  Other Topics Concern  . Not on file  Social History Narrative   Single.   No children.   Works at [employer].   Enjoys shopping, spending time with friends.    Family History  Problem Relation Age of Onset  . Hypothyroidism Mother   . Heart attack Father   . Diabetes Father   . Hyperlipidemia Father   . Cancer Maternal Grandmother        Unsure    Medications Prior to Admission  Medication Sig Dispense Refill Last Dose  . albuterol (VENTOLIN HFA) 108 (90 Base) MCG/ACT inhaler Inhale 2 puffs into the lungs every 6 (six) hours as needed. 18 g 2 10/12/2019 at Unknown time  . Prenatal Vit-Fe Fumarate-FA (PRENATAL MULTIVITAMIN) TABS tablet Take 3 tablets by mouth daily at 12 noon. 90 tablet 4 10/12/2019 at Unknown time  .  amoxicillin (AMOXIL) 500 MG capsule Take 1 capsule (500 mg total) by mouth 3 (three) times daily. 21 capsule 2   . Magnesium Oxide 400 (240 Mg) MG TABS Take 1 tablet (400 mg total) by mouth 2 (two) times daily. 60 tablet 1     Allergies  Allergen Reactions  . Sulfa Antibiotics Hives    Review of Systems: Negative except for what is mentioned in HPI.  Physical Exam: BP 108/74 (BP Location: Right Arm)   Pulse 85   Temp 98.9 F (37.2 C) (Oral)   Resp 16   Ht 5\' 1"  (1.549 m)   Wt 108.9 kg   LMP 01/13/2019 (Exact Date)   SpO2 97%   BMI 45.35 kg/m  GENERAL: Well-developed, well-nourished female in no acute distress.  LUNGS: Clear to auscultation bilaterally.  HEART: Regular rate and rhythm. ABDOMEN: Soft, nontender, nondistended, gravid. EXTREMITIES: Nontender, no edema, 2+ distal pulses. Cervical Exam: Dilation: Fingertip Effacement (%): 30, 40 Exam by:: Sherrye Payor RN FHT:  Baseline rate 140 bpm   Variability moderate  Accelerations present   Decelerations variable Contractions: Every none mins   Pertinent Labs/Studies:   Results for orders placed or performed during the hospital encounter of 10/13/19 (from the past 24 hour(s))  ROM Plus ([facility] only)     Status: None   Collection Time: 10/13/19  5:43 AM  Result Value Ref Range   Rom Plus POSITIVE   Type and screen [place]     Status: None (Preliminary result)   Collection Time: 10/13/19  6:18 AM  Result Value Ref Range   ABO/RH(D) PENDING    Antibody Screen PENDING    Sample Expiration      10/16/2019,2359 Performed at [facility], [address] [place], [place] [address]   SARS Coronavirus 2 by RT PCR (hospital order, performed in [place] hospital lab) Nasopharyngeal Nasopharyngeal Swab     Status: None   Collection Time: 10/13/19  6:18 AM   Specimen: Nasopharyngeal Swab  Result Value Ref Range   SARS Coronavirus 2 NEGATIVE NEGATIVE  CBC     Status: Abnormal   Collection Time: 10/13/19  7:05 AM  Result Value Ref Range   WBC 16.4 (H) 4.0 - 10.5 K/uL   RBC 4.11 3.87 - 5.11 MIL/uL   Hemoglobin 10.6 (L) 12.0 - 15.0 g/dL   HCT 33.3 (L) 36.0 - 46.0 %   MCV 81.0 80.0 - 100.0 fL   MCH 25.8 (L) 26.0 - 34.0 pg   MCHC 31.8 30.0 - 36.0 g/dL   RDW 14.9 11.5 - 15.5 %   Platelets 357 150 - 400 K/uL   nRBC 0.0 0.0 - 0.2 %    Assessment : Dolorez H Outen is a 23 y.o. G1P0000 at [redacted]w[redacted]d being admitted for labor.  Plan: Labor: Expectant management.  Induction/Augmentation as needed, per protocol FWB: Reassuring fetal heart tracing.  GBS negative Delivery plan: Hopeful for vaginal delivery  Melody Shambley, CNM [facility], [facility]

## 2019-10-13 NOTE — Progress Notes (Signed)
Pt. having difficulty managing pain despite 50 mcg fentanyl and position changes; requesting epidural. RN checked pt. to ensure no rapid change had occurred d/t pt.'s demeanor, inability to maintain control, and continuous screaming. Pt. made only slight change from 1 cm to 1.5 cm, and remains approximately 50% effaced with baby ballotable in the pelvis. RN called M. Gayla Medicus, CNM at pt.'s request. Midwife advised position changes, ambulation, birthing ball, rocking chair, warm shower, etc., but no epidural at this time as the pt. is still in early labor. RN relayed all information to pt. and pt's doula and educated pt. about early vs. active labor. CNM also advised switching from 50-100 mcg fentanyl to 1-2 mg stadol q1 hr PRN.  RN replaced wired monitors with wireless monitors to encourage freedom of movement and ambulation. However, the baby remains difficult to trace with an external FHM.

## 2019-10-13 NOTE — Lactation Note (Signed)
This note was copied from a baby's chart. Lactation Consultation Note  Patient Name: Vanessa Lindsey Today's Date: 10/13/2019 Reason for consult: Initial assessment;1st time breastfeeding  LC met with mom and baby [name] after delivery in LDR2. Townsend Roger was placed skin to skin in cradle position showing early hunger cues.  LC demonstrated to mom support with use of hand at base of neck, nose to nipple, chin to breast, and tummy turned in. [place] gave wide open mouth, with LC holding breast tissue in tea cup hold, [name] was able to grasp and sustained breast tissue in her mouth after a few attempts. Wide open mouth, flange lips displayed and baby began rhythmic sucking pattern. LC was able to show mom baby's top/bottom lips, and reviewed the importance of wide open mouth and flange lips when obtaining a latch. LC reviewed breastfeeding basics, newborn stomach size, feeding patterns, and early hunger cues. Encouraged mom to keep baby skin to skin as much as possible, and to feed with cues.    Maternal Data Formula Feeding for Exclusion: No Has patient been taught Hand Expression?: Yes Does the patient have breastfeeding experience prior to this delivery?: No  Feeding Feeding Type: Breast Fed  LATCH Score Latch: Repeated attempts needed to sustain latch, nipple held in mouth throughout feeding, stimulation needed to elicit sucking reflex.  Audible Swallowing: None  Type of Nipple: Everted at rest and after stimulation  Comfort (Breast/Nipple): Soft / non-tender  Hold (Positioning): Assistance needed to correctly position infant at breast and maintain latch.  LATCH Score: 6  Interventions Interventions: Breast feeding basics reviewed;Assisted with latch;Breast massage;Hand express;Breast compression;Adjust position;Support pillows;Position options  Lactation Tools Discussed/Used     Consult Status Consult Status: Follow-up Date: 10/13/19 Follow-up type:  In-patient    Lavonia Drafts 10/13/2019, 4:53 PM

## 2019-10-13 NOTE — Progress Notes (Addendum)
Pt. given 2 mg stadol at 1307. When RN attempted to administer the next scheduled dose of 50 mcg oral cytotec, pt. refused medication, stating she would not allow anymore interventions until she can get an epidural. RN documented pt.'s refusal of medication and notified provider. Provider stated they would be over to speak to the pt. shortly.  RN has encouraged pt. to get out of bed, ambulate around her room or the unit, take a warm shower, use the birthing ball, rock in the rocking chair, etc., but the pt. refuses to move from the bed.  Pt.'s baby remains difficult to trace due to maternal movement and BMI.

## 2019-10-13 NOTE — OB Triage Note (Signed)
Pt 23 yo, G1P0, [redacted]w[redacted]d presents to unit w c/o leaking fluid. Pt states she felt gush of fluid around 3 am, denies ctxs, reports fluid was clear with some white mucous and has continued to leak since 3 am. Last intercourse was yesterday. Reports fetal movement, denies vaginal bleeding. Monitors applied and assessing, VSS.

## 2019-10-13 NOTE — Progress Notes (Signed)
Pt. found drinking apple juice at approximately 0855 after administration of 50 mcg oral misoprostol (Cytotec) at 0839. Pt. was advised at time of administration to not eat or drink anything for 30 minutes after placement of medication. Pt was reminded to be NPO until 0909 for efficacy of medication.

## 2019-10-14 ENCOUNTER — Encounter: Payer: BC Managed Care – PPO | Admitting: Certified Nurse Midwife

## 2019-10-14 ENCOUNTER — Other Ambulatory Visit: Payer: BC Managed Care – PPO

## 2019-10-14 LAB — CBC
HCT: 30.5 % — ABNORMAL LOW (ref 36.0–46.0)
Hemoglobin: 9.7 g/dL — ABNORMAL LOW (ref 12.0–15.0)
MCH: 25.7 pg — ABNORMAL LOW (ref 26.0–34.0)
MCHC: 31.8 g/dL (ref 30.0–36.0)
MCV: 80.7 fL (ref 80.0–100.0)
Platelets: 311 10*3/uL (ref 150–400)
RBC: 3.78 MIL/uL — ABNORMAL LOW (ref 3.87–5.11)
RDW: 15.1 % (ref 11.5–15.5)
WBC: 17 10*3/uL — ABNORMAL HIGH (ref 4.0–10.5)
nRBC: 0 % (ref 0.0–0.2)

## 2019-10-14 NOTE — Progress Notes (Signed)
Patient ID: Vanessa Lindsey, female   DOB: 02-28-1996, 23 y.o.   MRN: [id number]  Post Partum Day # 1, s/p spontaneous vaginal birth, Rh positive, Obesity, Iron deficiency anemia with compounded blood loss anemia, Breastfeeding  Subjective:  Doing well, no questions or concerns. Eating lunch. Infant at bedside.   Denies difficulty breathing or respiratory distress, chest pain, abdominal pain, excessive vaginal bleeding, dysuria, and leg pain or swelling.   Objective:  Temp:  [97.7 F (36.5 C)-98.4 F (36.9 C)] 98.1 F (36.7 C) (10/14 0959) Pulse Rate:  [73-94] 94 (10/14 0959) Resp:  [18-20] 20 (10/14 0959) BP: (92-124)/(47-77) 124/71 (10/14 0959) SpO2:  [97 %-100 %] 98 % (10/14 0959)  Physical Exam:   General: alert and cooperative   Lungs: clear to auscultation bilaterally  Breasts: normal appearance, no masses or tenderness  Heart: normal apical impulse  Abdomen: soft, non-tender; bowel sounds normal; no masses,  no organomegaly  Pelvis: Lochia: appropriate, Uterine Fundus: firm  Extremities: DVT Evaluation: No evidence of DVT seen on physical exam.  Recent Labs    10/13/19 0705 10/14/19 0618  HGB 10.6* 9.7*  HCT 33.3* 30.5*   Edinburgh Postnatal Depression Scale Screening Tool 10/14/2019 10/13/2019  I have been able to laugh and see the funny side of things. 1 (No Data)  I have looked forward with enjoyment to things. 1 -  I have blamed myself unnecessarily when things went wrong. 3 -  I have been anxious or worried for no good reason. 1 -  I have felt scared or panicky for no good reason. 2 -  Things have been getting on top of me. 2 -  I have been so unhappy that I have had difficulty sleeping. 2 -  I have felt sad or miserable. 2 -  I have been so unhappy that I have been crying. 1 -  The thought of harming myself has occurred to me. 0 -  Edinburgh Postnatal Depression Scale Total 15 -     Assessment:  23 year old female, Post Partum Day # 1, s/p  spontaneous vaginal birth, Rh positive, Obesity, Iron deficiency anemia with compounded blood loss anemia  Breastfeeding  Plan:  Routine postpartum care and education.   Reviewed red flag symptoms and when to call.   Continue orders as written. Reassess as needed.   Will defer to pediatrician regarding anticipated date of discharge.    LOS: 1 day   Diona Fanti, CNM [facility], [facility] 10/14/2019 12:28 PM

## 2019-10-14 NOTE — Lactation Note (Signed)
This note was copied from a baby's chart. Lactation Consultation Note  Patient Name: Vanessa Lindsey Today's Date: 10/14/2019 Reason for consult: Follow-up assessment  LC checked in with mom and baby Vanessa Lindsey.  Vanessa Lindsey was in football position on the left breast, actively breastfeeding when [facility] entered. Baby was turned in towards mom, and mom was supporting the breast in sandwich hold.  Mom reports breastfeeding has gone well, 2 formula feeds overnight, but continues to plan on mainly breastfeeding. Mom is experiencing some nipple tenderness, and has her own nipple cream/lanolin from home that she desires to use. LC provided education on the impact of continued formula bottles on infants feeding behaviors and frequency, and on milk supply, encouraging to offer breast at each feeding first.  Vanessa Lindsey unlatched, and cried out, LC did observe a possible tongue restriction, when observing baby's mouth, mom voiced concerns of restriction, stating that she noticed it last night when the baby cried.  LC provided reassurance, and guidance for tracking baby's feeds, frequency and duration, wet/stool diapers, contentment post feeds, and shape/feel of mom's nipples throughout the day to day. LC has made RN assigned aware of possible restriction.  Mom was able to independently relatch Vanessa Lindsey to the same breast and Vanessa Lindsey began strong rhythmic sucking, mom had no complaints of pain or discomfort at this time. [facility] name and number written on whiteboard, mom encouraged to call out with next feed or with questions/concerns.  Maternal Data Formula Feeding for Exclusion: No Has patient been taught Hand Expression?: Yes Does the patient have breastfeeding experience prior to this delivery?: No  Feeding Feeding Type: Breast Fed  LATCH Score Latch: Repeated attempts needed to sustain latch, nipple held in mouth throughout feeding, stimulation needed to elicit sucking reflex.  Audible Swallowing:  None  Type of Nipple: Everted at rest and after stimulation  Comfort (Breast/Nipple): Soft / non-tender  Hold (Positioning): No assistance needed to correctly position infant at breast.  LATCH Score: 7  Interventions Interventions: Breast feeding basics reviewed;Breast massage;Position options;Support pillows  Lactation Tools Discussed/Used     Consult Status Consult Status: Follow-up Date: 10/14/19 Follow-up type: [place] 10/14/2019, 10:47 AM

## 2019-10-14 NOTE — Clinical Social Work Maternal (Signed)
CLINICAL SOCIAL WORK MATERNAL/CHILD NOTE  Patient Details  Name: Vanessa Lindsey MRN: [id number] Date of Birth: Jul 09, 1996  Date:  10/14/2019  Clinical Social Worker Initiating Note:  [employer], LCSW Date/Time: Initiated:  10/14/19/1330     Child's Name:  Vanessa Lindsey   Biological Parents:  Mother, Father   Need for Interpreter:  None   Reason for Referral:  Other (Comment)(depression screen)   Address:  [place] [address]    Phone number:  [phone] (home)     Additional phone number:   Household Members/Support Persons (HM/SP):       HM/SP Name Relationship DOB or Age  HM/SP -1        HM/SP -2        HM/SP -3        HM/SP -4        HM/SP -5        HM/SP -6        HM/SP -7        HM/SP -8          Natural Supports (not living in the home):  Spouse/significant other   Professional Supports:     Employment: Unemployed   Type of Work:     Education:  Nurse, adult   Homebound arranged:    Museum/gallery curator Resources:  [employer], Multimedia programmer   Other Resources:  [facility]   Cultural/Religious Considerations Which May Impact Care:    Strengths:  Ability to meet basic needs    Psychotropic Medications:         Pediatrician:       Pediatrician List:   [place]      Pediatrician Fax Number:    Risk Factors/Current Problems:  Family/Relationship Issues    Cognitive State:      Mood/Affect:  Tearful , Calm    CSW Assessment: Clinical Education officer, museum (CSW) received consult that mother scored a 58 on depression screen. CSW met with mother alone at bedside to provide resources. Mother reported that she lives in [place] with her mother Marlowe Kays and her cousin Mardene Celeste. Mother reported that this is her first baby and the father of the baby Jacqulyn Bath is very involved. Per mother she is in a relationship with [facility] and  he is very supportive. Per mother she has all the supplies needed for infant including 2 car seats. CSW provided education on used car seats and expiration dates. Mother reported that she has a bassinet and co-sleeper for infant to sleep in at home. CSW provided education on safe sleep. Mother reported that she has pregnancy Medicaid and WIC. Mother reported that she is also on her mother's [employer]. Mother reported that she is going to add baby to Medicaid and try to switch her Medicaid over to the family planning Medicaid. Mother reported that she is currently unemployed but plans to go back to school in January to be a Engineer, civil (consulting). Mother reported that she did use marijuana at the beginning of her pregnancy however she stopped using once she found out she was pregnant. Mother reported that she did not use any other drugs or drink alcohol. Mother reported that she is feeling sad because her mother will not allow Jose to come to their house. Mother also stated that Jose's mother will not allow her to come to their house. Jose lives with  his mother. Mother reported that her mother is dating a man who she feels is influencing her mother's decisions. Mother reported that she feels safe going back to her house and stated that her mother is supportive and does not want her to move out. Mother reported that she is going to apply for section 8 housing. CSW provided mother with [facility] and [place] housing authorities information. Mother reported that she is not having thoughts about hurting herself or anybody else. CSW provided education on postpartum mood disorders and provided mother with postpartum mood disorder information sheet. Mother accepted resources and thanked CSW for assistance. Mother reported no other needs or concerns. Per RN there are no other concerns and reported that infant will not be drug screened. CSW will continue to follow and assist as needed.   CSW  Plan/Description:  No Further Intervention Required/No Barriers to Discharge    Aliveah Gallant, Veronia Beets, Marlinda Mike 10/14/2019, 2:49 PM

## 2019-10-15 MED ORDER — IBUPROFEN 600 MG PO TABS: 600.0000 mg | ORAL_TABLET | Freq: Four times a day (QID) | ORAL | 1 refills | Status: DC

## 2019-10-15 MED ORDER — FERROUS SULFATE 325 (65 FE) MG PO TABS: 325.0000 mg | ORAL_TABLET | Freq: Two times a day (BID) | ORAL | 3 refills | Status: DC

## 2019-10-15 MED ORDER — ACETAMINOPHEN 325 MG PO TABS: 650.0000 mg | ORAL_TABLET | ORAL | 1 refills | Status: DC | PRN

## 2019-10-15 NOTE — Progress Notes (Signed)
Patient discharged home with infant. FOB present at discharge. Discharge instructions and prescriptions given and reviewed with patient. Patient verbalized understanding. Escorted out by staff. 

## 2019-10-15 NOTE — Discharge Instructions (Signed)
Contraception Choices Contraception, also called birth control, refers to methods or devices that prevent pregnancy. Hormonal methods Contraceptive implant  A contraceptive implant is a thin, plastic tube that contains a hormone. It is inserted into the upper part of the arm. It can remain in place for up to 3 years. Progestin-only injections Progestin-only injections are injections of progestin, a synthetic form of the hormone progesterone. They are given every 3 months by a health care provider. Birth control pills  Birth control pills are pills that contain hormones that prevent pregnancy. They must be taken once a day, preferably at the same time each day. Birth control patch  The birth control patch contains hormones that prevent pregnancy. It is placed on the skin and must be changed once a week for three weeks and removed on the fourth week. A prescription is needed to use this method of contraception. Vaginal ring  A vaginal ring contains hormones that prevent pregnancy. It is placed in the vagina for three weeks and removed on the fourth week. After that, the process is repeated with a new ring. A prescription is needed to use this method of contraception. Emergency contraceptive Emergency contraceptives prevent pregnancy after unprotected sex. They come in pill form and can be taken up to 5 days after sex. They work best the sooner they are taken after having sex. Most emergency contraceptives are available without a prescription. This method should not be used as your only form of birth control. Barrier methods Female condom  A female condom is a thin sheath that is worn over the penis during sex. Condoms keep sperm from going inside a woman's body. They can be used with a spermicide to increase their effectiveness. They should be disposed after a single use. Female condom  A female condom is a soft, loose-fitting sheath that is put into the vagina before sex. The condom keeps sperm  from going inside a woman's body. They should be disposed after a single use. Diaphragm  A diaphragm is a soft, dome-shaped barrier. It is inserted into the vagina before sex, along with a spermicide. The diaphragm blocks sperm from entering the uterus, and the spermicide kills sperm. A diaphragm should be left in the vagina for 6-8 hours after sex and removed within 24 hours. A diaphragm is prescribed and fitted by a health care provider. A diaphragm should be replaced every 1-2 years, after giving birth, after gaining more than 15 lb (6.8 kg), and after pelvic surgery. Cervical cap  A cervical cap is a round, soft latex or plastic cup that fits over the cervix. It is inserted into the vagina before sex, along with spermicide. It blocks sperm from entering the uterus. The cap should be left in place for 6-8 hours after sex and removed within 48 hours. A cervical cap must be prescribed and fitted by a health care provider. It should be replaced every 2 years. Sponge  A sponge is a soft, circular piece of polyurethane foam with spermicide on it. The sponge helps block sperm from entering the uterus, and the spermicide kills sperm. To use it, you make it wet and then insert it into the vagina. It should be inserted before sex, left in for at least 6 hours after sex, and removed and thrown away within 30 hours. Spermicides Spermicides are chemicals that kill or block sperm from entering the cervix and uterus. They can come as a cream, jelly, suppository, foam, or tablet. A spermicide should be inserted into the  vagina with an applicator at least 10-15 minutes before sex to allow time for it to work. The process must be repeated every time you have sex. Spermicides do not require a prescription. Intrauterine contraception Intrauterine device (IUD) An IUD is a T-shaped device that is put in a woman's uterus. There are two types:  Hormone IUD.This type contains progestin, a synthetic form of the hormone  progesterone. This type can stay in place for 3-5 years.  Copper IUD.This type is wrapped in copper wire. It can stay in place for 10 years.  Permanent methods of contraception Female tubal ligation In this method, a woman's fallopian tubes are sealed, tied, or blocked during surgery to prevent eggs from traveling to the uterus. Hysteroscopic sterilization In this method, a small, flexible insert is placed into each fallopian tube. The inserts cause scar tissue to form in the fallopian tubes and block them, so sperm cannot reach an egg. The procedure takes about 3 months to be effective. Another form of birth control must be used during those 3 months. Female sterilization This is a procedure to tie off the tubes that carry sperm (vasectomy). After the procedure, the man can still ejaculate fluid (semen). Natural planning methods Natural family planning In this method, a couple does not have sex on days when the woman could become pregnant. Calendar method This means keeping track of the length of each menstrual cycle, identifying the days when pregnancy can happen, and not having sex on those days. Ovulation method In this method, a couple avoids sex during ovulation. Symptothermal method This method involves not having sex during ovulation. The woman typically checks for ovulation by watching changes in her temperature and in the consistency of cervical mucus. Post-ovulation method In this method, a couple waits to have sex until after ovulation. Summary  Contraception, also called birth control, means methods or devices that prevent pregnancy.  Hormonal methods of contraception include implants, injections, pills, patches, vaginal rings, and emergency contraceptives.  Barrier methods of contraception can include female condoms, female condoms, diaphragms, cervical caps, sponges, and spermicides.  There are two types of IUDs (intrauterine devices). An IUD can be put in a woman's uterus to  prevent pregnancy for 3-5 years.  Permanent sterilization can be done through a procedure for males, females, or both.  Natural family planning methods involve not having sex on days when the woman could become pregnant. This information is not intended to replace advice given to you by your health care provider. Make sure you discuss any questions you have with your health care provider. Document Released: 12/17/2005 Document Revised: 12/19/2017 Document Reviewed: 01/19/2017 Elsevier Patient Education  2020 [employer].   Discharge Instructions:   Follow-up Appointment:  If there are any new medications, they have been ordered and will be available for pickup at the listed pharmacy on your way home from the hospital.   Call office if you have any of the following: headache, visual changes, fever >101.0 F, chills, shortness of breath, breast concerns, excessive vaginal bleeding, incision drainage or problems, leg pain or redness, depression or any other concerns. If you have vaginal discharge with an odor, let your doctor know.   It is normal to bleed for up to 6 weeks. You should not soak through more than 1 pad in 1 hour. If you have a blood clot larger than your fist with continued bleeding, call your doctor.   Activity: Do not lift > 10 lbs for 6 weeks (do not lift anything  heavier than your baby). No intercourse, tampons, swimming pools, hot tubs, baths (only showers) for 6 weeks.  No driving for 1-2 weeks. Continue prenatal vitamin, especially if breastfeeding. Increase calories and fluids (water) while breastfeeding.   Your milk will come in, in the next couple of days (right now it is colostrum). You may have a slight fever when your milk comes in, but it should go away on its own.  If it does not, and rises above 101 F please call the doctor. You will also feel achy and your breasts will be firm. They will also start to leak. If you are breastfeeding, continue as you have been  and you can pump/express milk for comfort.   If you have too much milk, your breasts can become engorged, which could lead to mastitis. This is an infection of the milk ducts. It can be very painful and you will need to notify your doctor to obtain a prescription for antibiotics. You can also treat it with a shower or hot/cold compress.   For concerns about your baby, please call your pediatrician.  For breastfeeding concerns, the lactation consultant can be reached at [phone].   Postpartum blues (feelings of happy one minute and sad another minute) are normal for the first few weeks but if it gets worse let your doctor know.   Congratulations! We enjoyed caring for you and your new bundle of joy!

## 2019-10-15 NOTE — Lactation Note (Signed)
This note was copied from a baby's chart. Lactation Consultation Note  Patient Name: Vanessa Lindsey Today's Date: 10/15/2019 Reason for consult: Follow-up assessment  LC went in to check on mom and baby. Baby was under phototherapy, and mom was just woken from a nap. Mom reports earlier attempt at breastfeeding, but baby would not latch. 69mL [place] formula was given via bottle; accepted well. LC reviewed jaundice babies are sleepier, encouraged skin to skin when possible before putting to the breast, hand expression of some colostrum onto the nipple to encourage latch. LC also discussed pumping routine, encouraging mom to utilize the DEBP that is set-up in the room for breast stimulation, and encouragement of frequent milk removal to ensure adequate milk supply for baby as she ages. Encouraged mom to call out to [facility] for any additional support, with questions or concerns, or assistance with pumping. Mom accepts the information but seems reluctant at this time to pump, hand express, or additional strategies at this time.  Maternal Data    Feeding Feeding Type: Bottle Fed - Formula Nipple Type: Slow - flow  LATCH Score                   Interventions Interventions: Breast feeding basics reviewed  Lactation Tools Discussed/Used     Consult Status Consult Status: Follow-up Date: 10/15/19 Follow-up type: In-patient    Lavonia Drafts 10/15/2019, 1:57 PM

## 2019-10-15 NOTE — Lactation Note (Signed)
This note was copied from a Vanessa's chart. Lactation Consultation Note  Patient Name: Vanessa Lindsey Today's Date: 10/15/2019 Reason for consult: Follow-up assessment  LC followed up with mom and Vanessa Lindsey. Townsend Roger has elevated bilirubin levels and has started triple phototherapy.  Mom reports Vanessa fed well at the breast last night, she did give 2 small amounts of formula supplement. DEBP was given, used 1x. Mom plans to try breastfeeding when Vanessa cues next, encouraged her to call out to [facility] for support and observation during the feed. Mom agrees.  Maternal Data Formula Feeding for Exclusion: No Has patient been taught Hand Expression?: Yes Does the patient have breastfeeding experience prior to this delivery?: No  Feeding    LATCH Score                   Interventions    Lactation Tools Discussed/Used WIC Program: Yes Date initiated:: 10/14/19   Consult Status Consult Status: Follow-up Date: 10/15/19 Follow-up type: In-patient    Lavonia Drafts 10/15/2019, 10:01 AM

## 2019-10-15 NOTE — Discharge Summary (Signed)
Obstetric Discharge Summary  Patient ID: Vanessa Lindsey MRN: [id number] DOB/AGE: Jan 24, 1996 23 y.o.   Date of Admission: 10/13/2019  Date of Discharge: 10/15/19  Admitting Diagnosis: Premature rupture of membrane at [redacted]w[redacted]d  Secondary Diagnosis: Obesity in pregnancy, Smoker, Excessive weight gain in pregnancy, Fetal symmetric IUGR, Anemia, History of Asthma  Mode of Delivery: Normal spontaneous vaginal delivery     Discharge Diagnosis: No other diagnosis   Intrapartum Procedures: Cytotec   Post partum procedures: None  Complications: EDPS: [place] is a G1P0000 who had a SVD on 10/13/2019;  for further details of this birth, please refer to the delivey summary.  Patient had an uncomplicated postpartum course.  By time of discharge on PPD#2, her pain was controlled on oral pain medications; she had appropriate lochia and was ambulating, voiding without difficulty and tolerating regular diet.  She was deemed stable for discharge to home.    Labs: CBC Latest Ref Rng & Units 10/14/2019 10/13/2019 04/27/2019  WBC 4.0 - 10.5 K/uL 17.0(H) 16.4(H) 14.5(H)  Hemoglobin 12.0 - 15.0 g/dL 9.7(L) 10.6(L) 12.1  Hematocrit 36.0 - 46.0 % 30.5(L) 33.3(L) 35.0  Platelets 150 - 400 K/uL 311 357 -   O POS  Physical exam:   Temp:  [97.8 F (36.6 C)-98.6 F (37 C)] 98.6 F (37 C) (10/15 0724) Pulse Rate:  [84-103] 88 (10/15 0724) Resp:  [18-20] 18 (10/15 0724) BP: (111-130)/(68-82) 111/70 (10/15 0724) SpO2:  [98 %-99 %] 99 % (10/15 0724)  General: alert and no distress  Lochia: appropriate  Abdomen: soft, NT  Uterine Fundus: firm  Perineum: no significant erythema  Extremities: No evidence of DVT seen on physical exam  Discharge Instructions: Per After Visit Summary  Activity: Advance as tolerated. Pelvic rest for 6 weeks.  Also refer to After Visit Summary  Diet: Regular  Medications: Allergies as of 10/15/2019      Reactions   Sulfa Antibiotics Hives      Medication List    STOP taking these medications   amoxicillin 500 MG capsule Commonly known as: AMOXIL   Magnesium Oxide 400 (240 Mg) MG Tabs     TAKE these medications   acetaminophen 325 MG tablet Commonly known as: Tylenol Take 2 tablets (650 mg total) by mouth every 4 (four) hours as needed (for pain scale < 4).   albuterol 108 (90 Base) MCG/ACT inhaler Commonly known as: VENTOLIN HFA Inhale 2 puffs into the lungs every 6 (six) hours as needed.   ferrous sulfate 325 (65 FE) MG tablet Take 1 tablet (325 mg total) by mouth 2 (two) times daily with a meal.   ibuprofen 600 MG tablet Commonly known as: ADVIL Take 1 tablet (600 mg total) by mouth every 6 (six) hours.   prenatal multivitamin Tabs tablet Take 3 tablets by mouth daily at 12 noon.      Outpatient follow up:  Follow-up Information    [facility]. Schedule an appointment as soon as possible for a visit in 2 week(s).   Why: Please call to schedule one (1) to two (2) week mood check  Contact information: [address]  [place] [phone]       Joylene Igo, [place]. Go on 11/20/2019.   Specialties: Obstetrics and Gynecology, Radiology Why: Please go to office for six (6) week postpartum appt as previously scheduled or sooner if needed Contact information: [place]  [place] [address] [phone]          Postpartum contraception: unsure, will discuss further at postpartum visit  Discharged Condition: stable  Discharged to: home   Newborn Data:  Disposition:rooming in  Apgars: APGAR (1 MIN): 8   APGAR (5 MINS): 8    Baby Feeding: Breast and Formula   Gunnar Bulla, CNM [facility], [facility] 10/15/19 9:14 AM

## 2019-10-15 NOTE — Lactation Note (Signed)
This note was copied from a baby's chart. Lactation Consultation Note  Patient Name: Vanessa Lindsey Today's Date: 10/15/2019 Reason for consult: Follow-up assessment  LC checked in with mom and baby. Baby is continuing phototherapy for elevated bilirubin levels. Mom reports breastfeeding 1x since last visit by [facility]. Breastfeeding lasted 8 minutes, and then mom gave 11mL of formula via bottle. Mom is experiencing some sore nipples, which may be from baby's inability to get an effective latch. Mom has DEBP set-up in room, and plans to pump shortly.  LC encouraged frequent pumping of every 2-3 hours for breast stimulation and encouragement of adequate milk supply as baby grows.  LC encouraged to use coconut oil before and after feeds and when using the pump. Mom had no questions/concerns before LC leaving. Encouraged to call if needed.  Maternal Data    Feeding Feeding Type: Breast Fed  LATCH Score                   Interventions Interventions: Breast feeding basics reviewed  Lactation Tools Discussed/Used     Consult Status Consult Status: Follow-up Date: 10/15/19 Follow-up type: In-patient    Vanessa Lindsey 10/15/2019, 4:35 PM

## 2019-10-15 NOTE — Progress Notes (Signed)
DC inst reviewed with pt.  Verb u/o of f/u appointment and red flag warning signs and symptoms.

## 2019-10-29 ENCOUNTER — Ambulatory Visit (INDEPENDENT_AMBULATORY_CARE_PROVIDER_SITE_OTHER): Payer: BC Managed Care – PPO | Admitting: Certified Nurse Midwife

## 2019-10-29 ENCOUNTER — Encounter: Payer: Self-pay | Admitting: Certified Nurse Midwife

## 2019-10-29 ENCOUNTER — Other Ambulatory Visit: Payer: Self-pay

## 2019-10-29 VITALS — BP 84/64 | HR 91 | Ht 61.0 in | Wt 211.3 lb

## 2019-10-29 DIAGNOSIS — Z1331 Encounter for screening for depression: Secondary | ICD-10-CM | POA: Diagnosis not present

## 2019-10-29 NOTE — Progress Notes (Signed)
Patient here for mood check, no complaints.  

## 2019-10-29 NOTE — Patient Instructions (Signed)

## 2019-10-29 NOTE — Progress Notes (Signed)
GYN ENCOUNTER NOTE  Subjective:       Vanessa Lindsey is a 23 y.o. G32P1001 female here for postpartum mood check, two (2) weeks status post spontaneous vaginal birth at [redacted] weeks gestation.  EPDS score 15 prior to discharge, no SI or HI. Feeling fine now, attributes mood to lack of visitors and "being stuck" at hospital.   Denies difficulty breathing or respiratory distress, chest pain, abdominal pain, excessive vaginal bleeding, dysuria, and leg pain or swelling.    Gynecologic History  No LMP recorded. (Menstrual status: Lactating).  Contraception: abstinence; will discuss further at six (6) week postpartum visit  Last Pap: 04/27/2019. Results were: normal  Obstetric History  OB History  Gravida Para Term Preterm AB Living  1 1 1  0 0 1  SAB TAB Ectopic Multiple Live Births  0 0 0 0 1    # Outcome Date GA Lbr Len/2nd Weight Sex Delivery Anes PTL Lv  1 Term 10/13/19 [redacted]w[redacted]d  6 lb 3 oz (2.807 kg) F Vag-Spont None N LIV    Past Medical History:  Diagnosis Date  . Allergy   . Asthma   . Chickenpox     No past surgical history on file.  Current Outpatient Medications on File Prior to Visit  Medication Sig Dispense Refill  . acetaminophen (TYLENOL) 325 MG tablet Take 2 tablets (650 mg total) by mouth every 4 (four) hours as needed (for pain scale < 4). 60 tablet 1  . albuterol (VENTOLIN HFA) 108 (90 Base) MCG/ACT inhaler Inhale 2 puffs into the lungs every 6 (six) hours as needed. 18 g 2  . ferrous sulfate 325 (65 FE) MG tablet Take 1 tablet (325 mg total) by mouth 2 (two) times daily with a meal. 60 tablet 3  . ibuprofen (ADVIL) 600 MG tablet Take 1 tablet (600 mg total) by mouth every 6 (six) hours. 30 tablet 1  . magnesium oxide (MAG-OX) 400 MG tablet Take 1 tablet by mouth 2 (two) times daily.    . Prenatal Vit-Fe Fumarate-FA (PRENATAL MULTIVITAMIN) TABS tablet Take 3 tablets by mouth daily at 12 noon. 90 tablet 4   No current facility-administered medications on file  prior to visit.     Allergies  Allergen Reactions  . Sulfa Antibiotics Hives    Social History   Socioeconomic History  . Marital status: Single    Spouse name: Not on file  . Number of children: Not on file  . Years of education: Not on file  . Highest education level: Not on file  Occupational History  . Not on file  Social Needs  . Financial resource strain: Not on file  . Food insecurity    Worry: Not on file    Inability: Not on file  . Transportation needs    Medical: Not on file    Non-medical: Not on file  Tobacco Use  . Smoking status: Current Every Day Smoker    Packs/day: 0.50    Types: Cigarettes  . Smokeless tobacco: Never Used  Substance and Sexual Activity  . Alcohol use: Not Currently  . Drug use: No  . Sexual activity: Not Currently    Birth control/protection: None  Lifestyle  . Physical activity    Days per week: Not on file    Minutes per session: Not on file  . Stress: Not on file  Relationships  . Social [redacted]w[redacted]d on phone: Not on file    Gets together: Not on file  Attends religious service: Not on file    Active member of club or organization: Not on file    Attends meetings of clubs or organizations: Not on file    Relationship status: Not on file  . Intimate partner violence    Fear of current or ex partner: Not on file    Emotionally abused: Not on file    Physically abused: Not on file    Forced sexual activity: Not on file  Other Topics Concern  . Not on file  Social History Narrative   Single.   No children.   Works at [employer].   Enjoys shopping, spending time with friends.    Family History  Problem Relation Age of Onset  . Hypothyroidism Mother   . Heart attack Father   . Diabetes Father   . Hyperlipidemia Father   . Cancer Maternal Grandmother        Unsure  . Breast cancer Neg Hx   . Ovarian cancer Neg Hx   . Colon cancer Neg Hx     The following portions of the patient's history were  reviewed and updated as appropriate: allergies, current medications, past family history, past medical history, past social history, past surgical history and problem list.  Review of Systems  ROS negative except as noted above. Information obtained from patient.   Objective:   BP (!) 84/64   Pulse 91   Ht 5\' 1"  (1.549 m)   Wt 211 lb 4.8 oz (95.8 kg)   Breastfeeding Yes   BMI 39.92 kg/m    CONSTITUTIONAL: Well-developed, well-nourished female in no acute distress.   PHYSICAL EXAM: Not indicated.   Depression screen PHQ 2/9 10/29/2019  Decreased Interest 1  Down, Depressed, Hopeless 0  PHQ - 2 Score 1  Altered sleeping 0  Tired, decreased energy 1  Change in appetite 1  Feeling bad or failure about yourself  0  Trouble concentrating 0  Moving slowly or fidgety/restless 0  Suicidal thoughts 0  PHQ-9 Score 3  Difficult doing work/chores Somewhat difficult   GAD 7 : Generalized Anxiety Score 10/29/2019  Nervous, Anxious, on Edge 0  Control/stop worrying 0  Worry too much - different things 1  Trouble relaxing 1  Restless 1  Easily annoyed or irritable 1  Afraid - awful might happen 0  Total GAD 7 Score 4  Anxiety Difficulty Somewhat difficult    Assessment:   1. Depression screen   2. Postpartum care and examination   Plan:   Screening findings reviewed with patient, verbalized understanding.   Reviewed red flag symptoms and when to call.   RTC for PPV as previously scheduled or sooner if needed.    Diona Fanti, CNM [facility], [facility] 10/29/19 11:39 AM

## 2019-11-18 NOTE — Progress Notes (Signed)
Subjective:    Vanessa Lindsey is a 23 y.o. G42P1001 Caucasian female who presents for a postpartum visit. She is 6 weeks postpartum following a spontaneous vaginal delivery at 39+0 gestational weeks. Anesthesia: none. I have fully reviewed the prenatal and intrapartum course.   Postpartum course has been uncomplicated. 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 Baby is feeding by breast.   Bleeding no bleeding. Bowel function is normal. Bladder function is normal.   Patient is sexually active. Contraception method is coitus interruptus and condoms.   Postpartum depression screening: negative. Score 2.    Last pap 04/27/2019 and was normal.  Denies difficulty breathing or respiratory distress, chest pain, abdominal pain, excessive vaginal bleeding, dysuria, and leg pain or swelling.   The following portions of the patient's history were reviewed and updated as appropriate: allergies, current medications, past medical history, past surgical history and problem list.  Review of Systems  Pertinent items are noted in HPI.   Objective:   BP (!) 86/62   Pulse 92   Ht 5\' 1"  (1.549 m)   Wt 207 lb 9.6 oz (94.2 kg)   LMP  (LMP Unknown)   Breastfeeding Yes   BMI 39.23 kg/m   General:  alert, cooperative and no distress   Breasts:  deferred, no complaints  Lungs: clear to auscultation bilaterally  Heart:  regular rate and rhythm  Abdomen: soft, nontender   Vulva: normal  Vagina: normal vagina  Cervix:  closed  Corpus: Well-involuted  Adnexa:  Non-palpable   Depression screen [facility] 2/9 11/19/2019 10/29/2019  Decreased Interest 0 1  Down, Depressed, Hopeless 0 0  PHQ - 2 Score 0 1  Altered sleeping 0 0  Tired, decreased energy 1 1  Change in appetite 1 1  Feeling bad or failure about yourself  0 0  Trouble concentrating 0 0  Moving slowly or fidgety/restless 0 0  Suicidal thoughts 0 0  PHQ-9 Score 2 3  Difficult doing work/chores Somewhat difficult Somewhat difficult          Assessment:   Postpartum exam Six (6) wks s/p spontaneous vaginal birth Breastfeeding Depression screening Contraception counseling   Plan:   Rx: Slynd, see orders. Samples given.  Encouraged routine health maintenance techniques.   Reviewed red flag symptoms and when to call.   Follow up in: 5-6 months for ANNUAL EXAM or earlier if needed.   Diona Fanti, CNM [facility], [facility] 11/18/19 8:49 PM

## 2019-11-19 ENCOUNTER — Encounter: Payer: Self-pay | Admitting: Certified Nurse Midwife

## 2019-11-19 ENCOUNTER — Ambulatory Visit (INDEPENDENT_AMBULATORY_CARE_PROVIDER_SITE_OTHER): Payer: BC Managed Care – PPO | Admitting: Certified Nurse Midwife

## 2019-11-19 ENCOUNTER — Other Ambulatory Visit: Payer: Self-pay

## 2019-11-19 MED ORDER — SLYND 4 MG PO TABS: 1.0000 | ORAL_TABLET | Freq: Every day | ORAL | 11 refills | Status: DC

## 2019-11-19 NOTE — Progress Notes (Signed)
Patient here for post-partum check, no complaints.  

## 2019-11-19 NOTE — Patient Instructions (Signed)
Preventive Care 21-25 Years Old, Female Preventive care refers to visits with your health care provider and lifestyle choices that can promote health and wellness. This includes:  A yearly physical exam. This may also be called an annual well check.  Regular dental visits and eye exams.  Immunizations.  Screening for certain conditions.  Healthy lifestyle choices, such as eating a healthy diet, getting regular exercise, not using drugs or products that contain nicotine and tobacco, and limiting alcohol use. What can I expect for my preventive care visit? Physical exam Your health care provider will check your:  Height and weight. This may be used to calculate body mass index (BMI), which tells if you are at a healthy weight.  Heart rate and blood pressure.  Skin for abnormal spots. Counseling Your health care provider may ask you questions about your:  Alcohol, tobacco, and drug use.  Emotional well-being.  Home and relationship well-being.  Sexual activity.  Eating habits.  Work and work environment.  Method of birth control.  Menstrual cycle.  Pregnancy history. What immunizations do I need?  Influenza (flu) vaccine  This is recommended every year. Tetanus, diphtheria, and pertussis (Tdap) vaccine  You may need a Td booster every 10 years. Varicella (chickenpox) vaccine  You may need this if you have not been vaccinated. Human papillomavirus (HPV) vaccine  If recommended by your health care provider, you may need three doses over 6 months. Measles, mumps, and rubella (MMR) vaccine  You may need at least one dose of MMR. You may also need a second dose. Meningococcal conjugate (MenACWY) vaccine  One dose is recommended if you are age 19-21 years and a first-year college student living in a residence hall, or if you have one of several medical conditions. You may also need additional booster doses. Pneumococcal conjugate (PCV13) vaccine  You may need  this if you have certain conditions and were not previously vaccinated. Pneumococcal polysaccharide (PPSV23) vaccine  You may need one or two doses if you smoke cigarettes or if you have certain conditions. Hepatitis A vaccine  You may need this if you have certain conditions or if you travel or work in places where you may be exposed to hepatitis A. Hepatitis B vaccine  You may need this if you have certain conditions or if you travel or work in places where you may be exposed to hepatitis B. Haemophilus influenzae type b (Hib) vaccine  You may need this if you have certain conditions. You may receive vaccines as individual doses or as more than one vaccine together in one shot (combination vaccines). Talk with your health care provider about the risks and benefits of combination vaccines. What tests do I need?  Blood tests  Lipid and cholesterol levels. These may be checked every 5 years starting at age 20.  Hepatitis C test.  Hepatitis B test. Screening  Diabetes screening. This is done by checking your blood sugar (glucose) after you have not eaten for a while (fasting).  Sexually transmitted disease (STD) testing.  BRCA-related cancer screening. This may be done if you have a family history of breast, ovarian, tubal, or peritoneal cancers.  Pelvic exam and Pap test. This may be done every 3 years starting at age 21. Starting at age 30, this may be done every 5 years if you have a Pap test in combination with an HPV test. Talk with your health care provider about your test results, treatment options, and if necessary, the need for more tests.   Follow these instructions at home: Eating and drinking   Eat a diet that includes fresh fruits and vegetables, whole grains, lean protein, and low-fat dairy.  Take vitamin and mineral supplements as recommended by your health care provider.  Do not drink alcohol if: ? Your health care provider tells you not to drink. ? You are  pregnant, may be pregnant, or are planning to become pregnant.  If you drink alcohol: ? Limit how much you have to 0-1 drink a day. ? Be aware of how much alcohol is in your drink. In the [place] one drink equals one 12 oz bottle of beer (355 mL), one 5 oz glass of wine (148 mL), or one 1 oz glass of hard liquor (44 mL). Lifestyle  Take daily care of your teeth and gums.  Stay active. Exercise for at least 30 minutes on 5 or more days each week.  Do not use any products that contain nicotine or tobacco, such as cigarettes, e-cigarettes, and chewing tobacco. If you need help quitting, ask your health care provider.  If you are sexually active, practice safe sex. Use a condom or other form of birth control (contraception) in order to prevent pregnancy and STIs (sexually transmitted infections). If you plan to become pregnant, see your health care provider for a preconception visit. What's next?  Visit your health care provider once a year for a well check visit.  Ask your health care provider how often you should have your eyes and teeth checked.  Stay up to date on all vaccines. This information is not intended to replace advice given to you by your health care provider. Make sure you discuss any questions you have with your health care provider. Document Released: 02/12/2002 Document Revised: 08/28/2018 Document Reviewed: 08/28/2018 Elsevier Patient Education  [place]. Drospirenone tablets (contraception) What is this medicine? DROSPIRENONE (dro SPY re nown) is an oral contraceptive (birth control pill). The product contains a female hormone known as a progestin. It is used to prevent pregnancy. This medicine may be used for other purposes; ask your health care provider or pharmacist if you have questions. COMMON BRAND NAME(S): FeRiva 21/7, SLYND What should I tell my health care provider before I take this medicine? They need to know if you have any of these conditions:   abnormal vaginal bleeding  adrenal gland disease  blood vessel disease or blood clots  breast, cervical, endometrial, ovarian, liver, or uterine cancer  diabetes  heart disease or recent heart attack  high potassium level  kidney disease  liver disease  mental depression  migraine headaches  stroke  an unusual or allergic reaction to drospirenone, progestins, or other medicines, foods, dyes, or preservatives  pregnant or trying to get pregnant  breast-feeding How should I use this medicine? Take this medicine by mouth. To reduce nausea, this medicine may be taken with food. Follow the directions on the prescription label. Take this medicine at the same time each day and in the order directed on the package. Do not take your medicine more often than directed. A patient package insert for the product will be given with each prescription and refill. Read this sheet carefully each time. The sheet may change frequently. Talk to your pediatrician regarding the use of this medicine in children. Special care may be needed. This medicine has been used in female children who have started having menstrual periods. Overdosage: If you think you have taken too much of this medicine contact a poison control center  or emergency room at once. NOTE: This medicine is only for you. Do not share this medicine with others. What if I miss a dose? If you miss a dose, take it as soon as you can and refer to the patient information sheet you received with your medicine for direction. If you miss more than one pill, this medicine may not be as effective and you may need to use another form of birth control. What may interact with this medicine? Do not take this medicine with any of the following medications:  atazanavir; cobicistat  bosentan  fosamprenavir This medicine may also interact with the following medications:  aprepitant  barbiturates like phenobarbital, primidone  carbamazepine   certain antibiotics like clarithromycin, rifampin, rifabutin, rifapentine  certain antivirals for HIV or hepatitis  certain diuretics like amiloride, spironolactone, triamterene  certain medicines for fungal infections like griseofulvin, ketoconazole, itraconazole, voriconazole  certain medicines for blood pressure, heart disease  cyclosporine  felbamate  heparin  medicines for diabetes  modafinil  NSAIDs, medicines for pain and inflammation, like ibuprofen or naproxen  oxcarbazepine  phenytoin  potassium supplements  rufinamide  St. John's wort  topiramate This list may not describe all possible interactions. Give your health care provider a list of all the medicines, herbs, non-prescription drugs, or dietary supplements you use. Also tell them if you smoke, drink alcohol, or use illegal drugs. Some items may interact with your medicine. What should I watch for while using this medicine? Visit your doctor or health care professional for regular checks on your progress. You will need a regular breast and pelvic exam and Pap smear while on this medicine. You may need blood work done while you are taking this medicine. If you have any reason to think you are pregnant, stop taking this medicine right away and contact your doctor or health care professional. This medicine does not protect you against HIV infection (AIDS) or any other sexually transmitted diseases. If you are going to have elective surgery, you may need to stop taking this medicine before the surgery. Consult your health care professional for advice. What side effects may I notice from receiving this medicine? Side effects that you should report to your doctor or health care professional as soon as possible:  allergic reactions like skin rash, itching or hives, swelling of the face, lips, or tongue  breast tissue changes or discharge  depressed mood  severe pain, swelling, or tenderness in the abdomen   signs and symptoms of a blood clot such as chest pain; shortness of breath; pain, swelling, or warmth in the leg  signs and symptoms of increased potassium like muscle weakness; chest pain; or fast, irregular heartbeat  signs and symptoms of liver injury like dark yellow or brown urine; general ill feeling or flu-like symptoms; light-colored stools; loss of appetite; nausea; right upper belly pain; unusually weak or tired; yellowing of the eyes or skin  signs and symptoms of a stroke like changes in vision; confusion; trouble speaking or understanding; severe headaches; sudden numbness or weakness of the face, arm or leg; trouble walking; dizziness; loss of balance or coordination  unusual vaginal bleeding  unusually weak or tired Side effects that usually do not require medical attention (report these to your doctor or health care professional if they continue or are bothersome):  acne  breast tenderness  headache  menstrual cramps  nausea  weight gain This list may not describe all possible side effects. Call your doctor for medical advice about side  effects. You may report side effects to FDA at 1-800-FDA-1088. Where should I keep my medicine? Keep out of the reach of children. Store at room temperature between 20 and 25 degrees C (68 and 77 degrees F). Throw away any unused medicine after the expiration date. NOTE: This sheet is a summary. It may not cover all possible information. If you have questions about this medicine, talk to your doctor, pharmacist, or health care provider.  2020 Elsevier/Gold Standard (2018-05-28 15:01:56)

## 2019-11-20 ENCOUNTER — Encounter: Payer: BC Managed Care – PPO | Admitting: Obstetrics and Gynecology

## 2020-01-26 ENCOUNTER — Telehealth: Payer: Self-pay

## 2020-01-26 NOTE — Telephone Encounter (Signed)
Called and spoke with patient after receiving rejection notice through [employer] for Slynd OCP.  Advised patient I could leave enough samples at the front until her next OV with [facility] in March so they can discuss what she recommends at that OV or I could send message to Dr. Valentino Saxon for recommendations.  Patient agreed to samples.  Samples left at the front for patient to pick up at her earliest convenience and patient verbalized understanding.

## 2020-03-18 ENCOUNTER — Other Ambulatory Visit: Payer: Self-pay

## 2020-03-18 ENCOUNTER — Ambulatory Visit (INDEPENDENT_AMBULATORY_CARE_PROVIDER_SITE_OTHER): Payer: Medicaid Other | Admitting: Certified Nurse Midwife

## 2020-03-18 ENCOUNTER — Other Ambulatory Visit (HOSPITAL_COMMUNITY)
Admission: RE | Admit: 2020-03-18 | Discharge: 2020-03-18 | Disposition: A | Discharge status: Home / Self Care | Payer: Medicaid Other | Source: Ambulatory Visit | Attending: Certified Nurse Midwife | Admitting: Certified Nurse Midwife

## 2020-03-18 ENCOUNTER — Encounter: Payer: Self-pay | Admitting: Certified Nurse Midwife

## 2020-03-18 VITALS — BP 94/65 | HR 99 | Ht 61.0 in | Wt 205.9 lb

## 2020-03-18 DIAGNOSIS — Z862 Personal history of diseases of the blood and blood-forming organs and certain disorders involving the immune mechanism: Secondary | ICD-10-CM | POA: Diagnosis not present

## 2020-03-18 DIAGNOSIS — Z113 Encounter for screening for infections with a predominantly sexual mode of transmission: Secondary | ICD-10-CM | POA: Insufficient documentation

## 2020-03-18 DIAGNOSIS — Z01419 Encounter for gynecological examination (general) (routine) without abnormal findings: Secondary | ICD-10-CM

## 2020-03-18 DIAGNOSIS — Z0000 Encounter for general adult medical examination without abnormal findings: Secondary | ICD-10-CM

## 2020-03-18 DIAGNOSIS — Z3041 Encounter for surveillance of contraceptive pills: Secondary | ICD-10-CM | POA: Diagnosis not present

## 2020-03-18 MED ORDER — NORETHINDRONE 0.35 MG PO TABS: 1.0000 | ORAL_TABLET | Freq: Every day | ORAL | 11 refills | Status: DC

## 2020-03-18 NOTE — Progress Notes (Signed)
Patient here for annual exam.  Patient stopped Slynd 2 months ago due to headaches and insurance coverage.  Patient wanting to discuss birth control options.

## 2020-03-18 NOTE — Patient Instructions (Signed)
Norethindrone tablets (contraception) What is this medicine? NORETHINDRONE (nor eth IN drone) is an oral contraceptive. The product contains a female hormone known as a progestin. It is used to prevent pregnancy. This medicine may be used for other purposes; ask your health care provider or pharmacist if you have questions. COMMON BRAND NAME(S): Camila, Deblitane 28-Day, Errin, Heather, Jencycla, Jolivette, Lyza, Nor-QD, Nora-BE, Norlyroc, Ortho Micronor, Sharobel 28-Day What should I tell my health care provider before I take this medicine? They need to know if you have any of these conditions:  blood vessel disease or blood clots  breast, cervical, or vaginal cancer  diabetes  heart disease  kidney disease  liver disease  mental depression  migraine  seizures  stroke  vaginal bleeding  an unusual or allergic reaction to norethindrone, other medicines, foods, dyes, or preservatives  pregnant or trying to get pregnant  breast-feeding How should I use this medicine? Take this medicine by mouth with a glass of water. You may take it with or without food. Follow the directions on the prescription label. Take this medicine at the same time each day and in the order directed on the package. Do not take your medicine more often than directed. Contact your pediatrician regarding the use of this medicine in children. Special care may be needed. This medicine has been used in female children who have started having menstrual periods. A patient package insert for the product will be given with each prescription and refill. Read this sheet carefully each time. The sheet may change frequently. Overdosage: If you think you have taken too much of this medicine contact a poison control center or emergency room at once. NOTE: This medicine is only for you. Do not share this medicine with others. What if I miss a dose? Try not to miss a dose. Every time you miss a dose or take a dose late  your chance of pregnancy increases. When 1 pill is missed (even if only 3 hours late), take the missed pill as soon as possible and continue taking a pill each day at the regular time (use a back up method of birth control for the next 48 hours). If more than 1 dose is missed, use an additional birth control method for the rest of your pill pack until menses occurs. Contact your health care professional if more than 1 dose has been missed. What may interact with this medicine? Do not take this medicine with any of the following medications:  amprenavir or fosamprenavir  bosentan This medicine may also interact with the following medications:  antibiotics or medicines for infections, especially rifampin, rifabutin, rifapentine, and griseofulvin, and possibly penicillins or tetracyclines  aprepitant  barbiturate medicines, such as phenobarbital  carbamazepine  felbamate  modafinil  oxcarbazepine  phenytoin  ritonavir or other medicines for HIV infection or AIDS  St. John's wort  topiramate This list may not describe all possible interactions. Give your health care provider a list of all the medicines, herbs, non-prescription drugs, or dietary supplements you use. Also tell them if you smoke, drink alcohol, or use illegal drugs. Some items may interact with your medicine. What should I watch for while using this medicine? Visit your doctor or health care professional for regular checks on your progress. You will need a regular breast and pelvic exam and Pap smear while on this medicine. Use an additional method of birth control during the first cycle that you take these tablets. If you have any reason to think you   are pregnant, stop taking this medicine right away and contact your doctor or health care professional. If you are taking this medicine for hormone related problems, it may take several cycles of use to see improvement in your condition. This medicine does not protect you  against HIV infection (AIDS) or any other sexually transmitted diseases. What side effects may I notice from receiving this medicine? Side effects that you should report to your doctor or health care professional as soon as possible:  breast tenderness or discharge  pain in the abdomen, chest, groin or leg  severe headache  skin rash, itching, or hives  sudden shortness of breath  unusually weak or tired  vision or speech problems  yellowing of skin or eyes Side effects that usually do not require medical attention (report to your doctor or health care professional if they continue or are bothersome):  changes in sexual desire  change in menstrual flow  facial hair growth  fluid retention and swelling  headache  irritability  nausea  weight gain or loss This list may not describe all possible side effects. Call your doctor for medical advice about side effects. You may report side effects to FDA at 1-800-FDA-1088. Where should I keep my medicine? Keep out of the reach of children. Store at room temperature between 15 and 30 degrees C (59 and 86 degrees F). Throw away any unused medicine after the expiration date. NOTE: This sheet is a summary. It may not cover all possible information. If you have questions about this medicine, talk to your doctor, pharmacist, or health care provider.  2020 Elsevier/Gold Standard (2012-09-05 16:41:35) Preventive Care 21-39 Years Old, Female Preventive care refers to visits with your health care provider and lifestyle choices that can promote health and wellness. This includes:  A yearly physical exam. This may also be called an annual well check.  Regular dental visits and eye exams.  Immunizations.  Screening for certain conditions.  Healthy lifestyle choices, such as eating a healthy diet, getting regular exercise, not using drugs or products that contain nicotine and tobacco, and limiting alcohol use. What can I expect for my  preventive care visit? Physical exam Your health care provider will check your:  Height and weight. This may be used to calculate body mass index (BMI), which tells if you are at a healthy weight.  Heart rate and blood pressure.  Skin for abnormal spots. Counseling Your health care provider may ask you questions about your:  Alcohol, tobacco, and drug use.  Emotional well-being.  Home and relationship well-being.  Sexual activity.  Eating habits.  Work and work environment.  Method of birth control.  Menstrual cycle.  Pregnancy history. What immunizations do I need?  Influenza (flu) vaccine  This is recommended every year. Tetanus, diphtheria, and pertussis (Tdap) vaccine  You may need a Td booster every 10 years. Varicella (chickenpox) vaccine  You may need this if you have not been vaccinated. Human papillomavirus (HPV) vaccine  If recommended by your health care provider, you may need three doses over 6 months. Measles, mumps, and rubella (MMR) vaccine  You may need at least one dose of MMR. You may also need a second dose. Meningococcal conjugate (MenACWY) vaccine  One dose is recommended if you are age 19-21 years and a first-year college student living in a residence hall, or if you have one of several medical conditions. You may also need additional booster doses. Pneumococcal conjugate (PCV13) vaccine  You may need this if   this if you have certain conditions and were not previously vaccinated. Pneumococcal polysaccharide (PPSV23) vaccine  You may need one or two doses if you smoke cigarettes or if you have certain conditions. Hepatitis A vaccine  You may need this if you have certain conditions or if you travel or work in places where you may be exposed to hepatitis A. Hepatitis B vaccine  You may need this if you have certain conditions or if you travel or work in places where you may be exposed to hepatitis B. Haemophilus influenzae type b (Hib)  vaccine  You may need this if you have certain conditions. You may receive vaccines as individual doses or as more than one vaccine together in one shot (combination vaccines). Talk with your health care provider about the risks and benefits of combination vaccines. What tests do I need?  Blood tests  Lipid and cholesterol levels. These may be checked every 5 years starting at age 70.  Hepatitis C test.  Hepatitis B test. Screening  Diabetes screening. This is done by checking your blood sugar (glucose) after you have not eaten for a while (fasting).  Sexually transmitted disease (STD) testing.  BRCA-related cancer screening. This may be done if you have a family history of breast, ovarian, tubal, or peritoneal cancers.  Pelvic exam and Pap test. This may be done every 3 years starting at age 63. Starting at age 47, this may be done every 5 years if you have a Pap test in combination with an HPV test. Talk with your health care provider about your test results, treatment options, and if necessary, the need for more tests. Follow these instructions at home: Eating and drinking   Eat a diet that includes fresh fruits and vegetables, whole grains, lean protein, and low-fat dairy.  Take vitamin and mineral supplements as recommended by your health care provider.  Do not drink alcohol if: ? Your health care provider tells you not to drink. ? You are pregnant, may be pregnant, or are planning to become pregnant.  If you drink alcohol: ? Limit how much you have to 0-1 drink a day. ? Be aware of how much alcohol is in your drink. In the [place] one drink equals one 12 oz bottle of beer (355 mL), one 5 oz glass of wine (148 mL), or one 1 oz glass of hard liquor (44 mL). Lifestyle  Take daily care of your teeth and gums.  Stay active. Exercise for at least 30 minutes on 5 or more days each week.  Do not use any products that contain nicotine or tobacco, such as cigarettes,  e-cigarettes, and chewing tobacco. If you need help quitting, ask your health care provider.  If you are sexually active, practice safe sex. Use a condom or other form of birth control (contraception) in order to prevent pregnancy and STIs (sexually transmitted infections). If you plan to become pregnant, see your health care provider for a preconception visit. What's next?  Visit your health care provider once a year for a well check visit.  Ask your health care provider how often you should have your eyes and teeth checked.  Stay up to date on all vaccines. This information is not intended to replace advice given to you by your health care provider. Make sure you discuss any questions you have with your health care provider. Document Revised: 08/28/2018 Document Reviewed: 08/28/2018 Elsevier Patient Education  2020 [employer].

## 2020-03-18 NOTE — Progress Notes (Signed)
ANNUAL PREVENTATIVE CARE GYN  ENCOUNTER NOTE  Subjective:       Vanessa Lindsey is a 24 y.o. G59P1001 female here for a routine annual gynecologic exam.  Current complaints: 1. Desires POP covered by insurance, stopped Slynd approximately two (2) months ago 2. Requests STI screening  Denies difficulty breathing or respiratory distress, chest pain, abdominal pain, excessive vaginal bleeding, dysuria, and leg pain or swelling.    Gynecologic History  Patient's last menstrual period was 03/03/2020 (approximate). Period Cycle (Days): 28 Period Duration (Days): 7 Period Pattern: Regular Menstrual Flow: Moderate Menstrual Control: Maxi pad Dysmenorrhea: (!) Severe Dysmenorrhea Symptoms: Cramping  Contraception: condoms  Last Pap: 04/2019. Results were: normal  Obstetric History  OB History  Gravida Para Term Preterm AB Living  1 1 1  0 0 1  SAB TAB Ectopic Multiple Live Births  0 0 0 0 1    # Outcome Date GA Lbr Len/2nd Weight Sex Delivery Anes PTL Lv  1 Term 10/13/19 [redacted]w[redacted]d  6 lb 3 oz (2.807 kg) F Vag-Spont None N LIV    Past Medical History:  Diagnosis Date  . Allergy   . Asthma   . Chickenpox     No past surgical history on file.  Current Outpatient Medications on File Prior to Visit  Medication Sig Dispense Refill  . albuterol (VENTOLIN HFA) 108 (90 Base) MCG/ACT inhaler Inhale 2 puffs into the lungs every 6 (six) hours as needed. 18 g 2  . Prenatal Vit-Fe Fumarate-FA (PRENATAL MULTIVITAMIN) TABS tablet Take 3 tablets by mouth daily at 12 noon. 90 tablet 4  . Drospirenone (SLYND) 4 MG TABS Take 1 tablet by mouth daily. (Patient not taking: Reported on 03/18/2020) 28 tablet 11   No current facility-administered medications on file prior to visit.    Allergies  Allergen Reactions  . Sulfa Antibiotics Hives    Social History   Socioeconomic History  . Marital status: Single    Spouse name: Not on file  . Number of children: 1  . Years of education: Not on  file  . Highest education level: Not on file  Occupational History  . Not on file  Tobacco Use  . Smoking status: Current Some Day Smoker    Packs/day: 0.25    Types: Cigarettes  . Smokeless tobacco: Never Used  Substance and Sexual Activity  . Alcohol use: Not Currently  . Drug use: No  . Sexual activity: Yes    Birth control/protection: Condom  Other Topics Concern  . Not on file  Social History Narrative   Single.   Works at [employer].   Enjoys shopping, spending time with friends.   Social Determinants of Health   Financial Resource Strain:   . Difficulty of Paying Living Expenses:   Food Insecurity:   . Worried About Charity fundraiser in the Last Year:   . Arboriculturist in the Last Year:   Transportation Needs:   . Film/video editor (Medical):   Marland Kitchen Lack of Transportation (Non-Medical):   Physical Activity:   . Days of Exercise per Week:   . Minutes of Exercise per Session:   Stress:   . Feeling of Stress :   Social Connections:   . Frequency of Communication with Friends and Family:   . Frequency of Social Gatherings with Friends and Family:   . Attends Religious Services:   . Active Member of Clubs or Organizations:   . Attends Archivist Meetings:   .  Marital Status:   Intimate Partner Violence:   . Fear of Current or Ex-Partner:   . Emotionally Abused:   Marland Kitchen Physically Abused:   . Sexually Abused:     Family History  Problem Relation Age of Onset  . Hypothyroidism Mother   . Heart attack Father   . Diabetes Father   . Hyperlipidemia Father   . Cancer Maternal Grandmother        Unsure  . Breast cancer Neg Hx   . Ovarian cancer Neg Hx   . Colon cancer Neg Hx     The following portions of the patient's history were reviewed and updated as appropriate: allergies, current medications, past family history, past medical history, past social history, past surgical history and problem list.  Review of Systems  ROS negative except  as noted above. Information obtained from patient.    Objective:   BP 94/65   Pulse 99   Ht 5\' 1"  (1.549 m)   Wt 205 lb 14.4 oz (93.4 kg)   LMP 03/03/2020 (Approximate)   Breastfeeding Yes   BMI 38.90 kg/m    CONSTITUTIONAL: Well-developed, well-nourished female in no acute distress.   PSYCHIATRIC: Normal mood and affect. Normal behavior. Normal judgment and thought content.  NEUROLGIC: Alert and oriented to person, place, and time. Normal muscle tone coordination. No cranial nerve deficit noted.  HENT:  Normocephalic, atraumatic, External right and left ear normal.   EYES: Conjunctivae and EOM are normal. Pupils are equal and round.   NECK: Normal range of motion, supple, no masses.  Normal thyroid.   SKIN: Skin is warm and dry. No rash noted. Not diaphoretic. No erythema. No pallor. Professional tattoos present.   CARDIOVASCULAR: Normal heart rate noted, regular rhythm, no murmur.  RESPIRATORY: Clear to auscultation bilaterally. Effort and breath sounds normal, no problems with respiration noted.  BREASTS: Symmetric in size. No masses, skin changes, nipple drainage, or lymphadenopathy. Lactating.   ABDOMEN: Soft, normal bowel sounds, no distention noted.  No tenderness, rebound or guarding.   PELVIC:  External Genitalia: Normal  Vagina: Normal  Cervix: Normal  Uterus: Normal  Adnexa: Normal   MUSCULOSKELETAL: Normal range of motion. No tenderness.  No cyanosis, clubbing, or edema.  2+ distal pulses.  LYMPHATIC: No Axillary, Supraclavicular, or Inguinal Adenopathy.  Assessment:   Annual gynecologic examination 24 y.o.   Contraception: oral progesterone-only contraceptive, Camila  Obesity 2   Problem List Items Addressed This Visit    None    Visit Diagnoses    Well woman exam    -  Primary   Relevant Orders   Cervicovaginal ancillary only   HIV antibody (with reflex)   RPR   Encounter for surveillance of contraceptive pills       History of anemia        Screen for STD (sexually transmitted disease)       Relevant Orders   Cervicovaginal ancillary only   HIV antibody (with reflex)   RPR      Plan:   Pap: GC/CT NAAT  Labs: See orders   Routine preventative health maintenance measures emphasized: Exercise/Diet/Weight control, Tobacco Warnings, Alcohol/Substance use risks, Stress Management, Peer Pressure Issues and Safe Sex; see AVS  Rx Camila, see orders  Reviewed red flag symptoms and when to call  Return to Clinic - 1 Year   30, CNM [facility], [facility] 03/18/20 2:55 PM

## 2020-03-19 LAB — CBC
Hematocrit: 40.1 % (ref 34.0–46.6)
Hemoglobin: 13 g/dL (ref 11.1–15.9)
MCH: 27.3 pg (ref 26.6–33.0)
MCHC: 32.4 g/dL (ref 31.5–35.7)
MCV: 84 fL (ref 79–97)
Platelets: 379 10*3/uL (ref 150–450)
RBC: 4.76 x10E6/uL (ref 3.77–5.28)
RDW: 13.8 % (ref 11.7–15.4)
WBC: 8.1 10*3/uL (ref 3.4–10.8)

## 2020-03-19 LAB — HIV ANTIBODY (ROUTINE TESTING W REFLEX): HIV Screen 4th Generation wRfx: NONREACTIVE

## 2020-03-19 LAB — RPR: RPR Ser Ql: NONREACTIVE

## 2020-03-22 LAB — CERVICOVAGINAL ANCILLARY ONLY
Bacterial Vaginitis (gardnerella): NEGATIVE
Candida Glabrata: NEGATIVE
Candida Vaginitis: POSITIVE — AB
Chlamydia: NEGATIVE
Comment: NEGATIVE
Comment: NEGATIVE
Comment: NEGATIVE
Comment: NEGATIVE
Comment: NEGATIVE
Comment: NORMAL
Neisseria Gonorrhea: NEGATIVE
Trichomonas: NEGATIVE

## 2020-03-30 ENCOUNTER — Other Ambulatory Visit (INDEPENDENT_AMBULATORY_CARE_PROVIDER_SITE_OTHER): Payer: Medicaid Other | Admitting: Certified Nurse Midwife

## 2020-03-30 DIAGNOSIS — B373 Candidiasis of vulva and vagina: Secondary | ICD-10-CM

## 2020-03-30 DIAGNOSIS — B3731 Acute candidiasis of vulva and vagina: Secondary | ICD-10-CM

## 2020-03-30 MED ORDER — TERCONAZOLE 0.4 % VA CREA: 1.0000 | TOPICAL_CREAM | Freq: Every day | VAGINAL | 0 refills | Status: DC

## 2020-03-30 NOTE — Progress Notes (Signed)
Rx Terazol, see orders.    Gunnar Bulla, CNM [facility], [facility] 03/30/20 11:57 AM

## 2020-05-06 ENCOUNTER — Telehealth: Payer: Self-pay

## 2020-05-06 ENCOUNTER — Ambulatory Visit: Payer: Medicaid Other | Admitting: Certified Nurse Midwife

## 2020-05-06 NOTE — Telephone Encounter (Signed)
Voicemail message left for patient to return my call. Also a Clinical cytogeneticist message sent.

## 2020-08-09 ENCOUNTER — Telehealth: Payer: Self-pay | Admitting: Certified Nurse Midwife

## 2020-08-09 DIAGNOSIS — Z789 Other specified health status: Secondary | ICD-10-CM

## 2020-08-09 MED ORDER — ELLA 30 MG PO TABS: 1.0000 | ORAL_TABLET | Freq: Once | ORAL | 0 refills | Status: AC

## 2020-08-09 NOTE — Telephone Encounter (Signed)
Thank you [name].  

## 2020-08-09 NOTE — Telephone Encounter (Signed)
Rx ella, see orders.    Serafina Royals, CNM [facility], [facility] 08/09/20 10:32 AM

## 2020-08-09 NOTE — Telephone Encounter (Signed)
Prescription sent to pharmacy on file. Thanks, Serafina Royals, CNM

## 2020-08-09 NOTE — Telephone Encounter (Signed)
Patient called in requesting a prescription for a emergency contraception be called into her pharmacy.   Informed patient that her provider was out of the office and would return on Thursday however Crystal Hyacinth Meeker informed me to send this message to Vanessa Lindsey to see if she wouldn't mind calling this in for the patient.

## 2020-10-05 ENCOUNTER — Telehealth: Payer: Self-pay

## 2020-10-05 NOTE — Telephone Encounter (Signed)
Called and spoke to pt. Pt states she has not breast fed for about 6-7 months however, is still lactating. Per [facility], informed pt not to squeeze or stimulate her breast. Also, we are unable to prescribe her anything for this. Pt denies any redness on the breast, pain, or fever.  Informed pt to wait it out. Informed pt if she develops clogged milk duct, fever, pain, redness to contact us. Pt verbalized understanding.

## 2020-10-05 NOTE — Telephone Encounter (Signed)
Patient called in stating that she has not breast fed in 6-7 months and states that she is still lactating. Could you please advise? Patient is unsure if this is normal.

## 2020-11-02 DIAGNOSIS — Z20822 Contact with and (suspected) exposure to covid-19: Secondary | ICD-10-CM | POA: Diagnosis not present

## 2020-11-02 DIAGNOSIS — J029 Acute pharyngitis, unspecified: Secondary | ICD-10-CM | POA: Diagnosis not present

## 2020-11-02 DIAGNOSIS — J069 Acute upper respiratory infection, unspecified: Secondary | ICD-10-CM | POA: Diagnosis not present

## 2020-11-16 DIAGNOSIS — Z20822 Contact with and (suspected) exposure to covid-19: Secondary | ICD-10-CM | POA: Diagnosis not present

## 2021-02-21 DIAGNOSIS — H10022 Other mucopurulent conjunctivitis, left eye: Secondary | ICD-10-CM | POA: Diagnosis not present

## 2021-02-21 DIAGNOSIS — J069 Acute upper respiratory infection, unspecified: Secondary | ICD-10-CM | POA: Diagnosis not present

## 2021-03-20 ENCOUNTER — Encounter: Payer: Medicaid Other | Admitting: Certified Nurse Midwife

## 2021-04-06 ENCOUNTER — Encounter: Payer: Self-pay | Admitting: Certified Nurse Midwife

## 2021-04-13 DIAGNOSIS — E7800 Pure hypercholesterolemia, unspecified: Secondary | ICD-10-CM | POA: Diagnosis not present

## 2021-04-13 DIAGNOSIS — E039 Hypothyroidism, unspecified: Secondary | ICD-10-CM | POA: Diagnosis not present

## 2021-04-13 DIAGNOSIS — E559 Vitamin D deficiency, unspecified: Secondary | ICD-10-CM | POA: Diagnosis not present

## 2021-04-13 DIAGNOSIS — E119 Type 2 diabetes mellitus without complications: Secondary | ICD-10-CM | POA: Diagnosis not present

## 2021-04-13 DIAGNOSIS — D519 Vitamin B12 deficiency anemia, unspecified: Secondary | ICD-10-CM | POA: Diagnosis not present

## 2021-06-28 ENCOUNTER — Telehealth: Payer: Self-pay | Admitting: Certified Nurse Midwife

## 2021-06-28 NOTE — Telephone Encounter (Signed)
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  When explained to me the concern I suggested that she call the child's pediatrician.  Patient insisted on speaking with Marcelino Duster for advice.

## 2021-06-30 NOTE — Telephone Encounter (Signed)
Telephone call to patient, verified full name and date of birth.   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    Serafina Royals, CNM [facility], [facility] 06/30/21 4:37 PM

## 2021-07-14 DIAGNOSIS — J0300 Acute streptococcal tonsillitis, unspecified: Secondary | ICD-10-CM | POA: Diagnosis not present

## 2021-07-14 DIAGNOSIS — R070 Pain in throat: Secondary | ICD-10-CM | POA: Diagnosis not present

## 2021-11-16 DIAGNOSIS — E039 Hypothyroidism, unspecified: Secondary | ICD-10-CM | POA: Diagnosis not present

## 2021-11-16 DIAGNOSIS — E7800 Pure hypercholesterolemia, unspecified: Secondary | ICD-10-CM | POA: Diagnosis not present

## 2021-11-16 DIAGNOSIS — E559 Vitamin D deficiency, unspecified: Secondary | ICD-10-CM | POA: Diagnosis not present

## 2021-12-31 NOTE — L&D Delivery Note (Addendum)
Date of delivery: 12/19/2022 Estimated Date of Delivery: 12/17/22 Patient's last menstrual period was 03/18/2022. EGA: [redacted]w[redacted]d  Delivery Note At 7:18 AM a viable female was delivered via Vaginal, Spontaneous  Presentation: OA, Right Occiput Anterior APGAR: 9, 9       Weight: 2890 g, 6 pounds 6 ounces   Placenta status: Spontaneous, Intact.   Cord: 3 vessels with the following complications: None.  Cord pH: NA  Patient comfortable with epidural. She was found to be complete and +2 at regular check. Mom pushed one time to deliver a viable female infant.  The head followed by shoulders, which delivered without difficulty, and the rest of the body.  Nuchal cord noted and reduced on the perineum.  Baby to mom's chest.  Cord clamped and cut after 3 min delay.  Cord blood obtained.  Placenta delivered spontaneously, intact, with a 3-vessel cord.   All counts correct.  Hemostasis obtained with IV pitocin and fundal massage.     Anesthesia: Epidural Episiotomy: None Lacerations: None Suture Repair:  NA Est. Blood Loss (mL): 100  Mom to postpartum.  Baby to Couplet care / Skin to Skin.   Tresea Mall, CNM 12/19/2022, 8:08 AM

## 2022-02-16 DIAGNOSIS — E559 Vitamin D deficiency, unspecified: Secondary | ICD-10-CM | POA: Diagnosis not present

## 2022-02-16 DIAGNOSIS — E039 Hypothyroidism, unspecified: Secondary | ICD-10-CM | POA: Diagnosis not present

## 2022-02-16 DIAGNOSIS — E7800 Pure hypercholesterolemia, unspecified: Secondary | ICD-10-CM | POA: Diagnosis not present

## 2022-04-25 ENCOUNTER — Encounter: Payer: Medicaid Other | Admitting: Obstetrics

## 2022-05-03 ENCOUNTER — Ambulatory Visit (INDEPENDENT_AMBULATORY_CARE_PROVIDER_SITE_OTHER): Payer: Medicaid Other | Admitting: Obstetrics

## 2022-05-03 ENCOUNTER — Encounter: Payer: Self-pay | Admitting: Obstetrics

## 2022-05-03 VITALS — BP 113/76 | HR 89 | Ht 61.0 in | Wt 205.6 lb

## 2022-05-03 DIAGNOSIS — Z3200 Encounter for pregnancy test, result unknown: Secondary | ICD-10-CM | POA: Diagnosis not present

## 2022-05-03 DIAGNOSIS — F32A Depression, unspecified: Secondary | ICD-10-CM | POA: Diagnosis not present

## 2022-05-03 DIAGNOSIS — O99341 Other mental disorders complicating pregnancy, first trimester: Secondary | ICD-10-CM | POA: Diagnosis not present

## 2022-05-03 LAB — POCT URINE PREGNANCY: Preg Test, Ur: POSITIVE — AB

## 2022-05-03 NOTE — Progress Notes (Signed)
SUBJECTIVE ? ?Vanessa Lindsey is a 26 y.o. female who presents for evaluation of amenorrhea and possible pregnancy. Patient's last menstrual period was 03/18/2022.   She had taken Plan B in late February, had a short period that started on 03/08/22, and then had another period 03/18/22 . She reports prior to pregnancy, she had several regular periods. ?Pregnancy is desired, but the timing is not the best due to her living situation.  She currently lives with her mother and is working on finding affordable housing with her partner. ?Current symptoms include breast tenderness, fatigue, and nausea. She also reports pain over her right ovary, ? ?Review of Systems ?Pertinent items are noted in HPI. ? ?OBJECTIVE ?BP 113/76   Pulse 89   Ht 5\' 1"  (1.549 m)   Wt 205 lb 9.6 oz (93.3 kg)   LMP 03/18/2022   BMI 38.85 kg/m?  ?Body mass index is 38.85 kg/m?. ? ?alert, well appearing, and in no distress ? ?Lab Review ?Urine hCG: positive ? ?ASSESSMENT ?Amenorrhea. Presumed pregnancy at [redacted]w[redacted]d with EDD of Estimated Date of Delivery: 12/23/22. ? ?PLAN  ?Encouraged a well-balanced diet, rest, hydration, prenatal vitamins, and walking for exercise. Counseled to avoid alcohol, tobacco, and recreational drugs and to minimize caffeine intake. She reports smoking cigarettes, but is cutting back and working on quitting. Safe medications list given. Discussed non-pharmacologic relief measures for nausea. Merissa feels that she has good coping strategies for managing her depression but is interested in a referral to a counselor. Will reach out to 12/25/22 for help with housing resources. Reviewed midwifery care prenatal visits and birth. Genetic screening options were discussed.  An Gayland Curry was ordered for dating and to evaluate ovarian pain. Return in 2-3 weeks for a nurse visit for labs and prenatal education, and 6-7 weeks for a NOB physical and genetic screening if desired.  ? ?[place], CNM  ?

## 2022-05-14 ENCOUNTER — Ambulatory Visit (INDEPENDENT_AMBULATORY_CARE_PROVIDER_SITE_OTHER): Payer: Medicaid Other

## 2022-05-14 DIAGNOSIS — Z3481 Encounter for supervision of other normal pregnancy, first trimester: Secondary | ICD-10-CM | POA: Diagnosis not present

## 2022-05-14 DIAGNOSIS — Z3A09 9 weeks gestation of pregnancy: Secondary | ICD-10-CM

## 2022-05-14 DIAGNOSIS — Z3200 Encounter for pregnancy test, result unknown: Secondary | ICD-10-CM

## 2022-05-21 ENCOUNTER — Ambulatory Visit (INDEPENDENT_AMBULATORY_CARE_PROVIDER_SITE_OTHER): Payer: Medicaid Other | Admitting: Obstetrics

## 2022-05-21 DIAGNOSIS — Z3689 Encounter for other specified antenatal screening: Secondary | ICD-10-CM

## 2022-05-21 DIAGNOSIS — Z3480 Encounter for supervision of other normal pregnancy, unspecified trimester: Secondary | ICD-10-CM

## 2022-05-21 DIAGNOSIS — Z113 Encounter for screening for infections with a predominantly sexual mode of transmission: Secondary | ICD-10-CM

## 2022-05-21 DIAGNOSIS — E669 Obesity, unspecified: Secondary | ICD-10-CM

## 2022-05-21 NOTE — Progress Notes (Signed)
New OB Intake  I connected with  Vanessa Lindsey on 05/21/22 at 10:30 AM EDT by phone call  it and verified that I am speaking with the correct person using two identifiers. Nurse is located at [facility] and pt is located at home.  I discussed the limitations, risks, security and privacy concerns of performing an evaluation and management service by telephone and the availability of in person appointments. I also discussed with the patient that there may be a patient responsible charge related to this service. The patient expressed understanding and agreed to proceed.  I explained I am completing New OB Intake today. We discussed her EDD of 12/23/2022 that is based on LMP of 03/18/22. Pt is G2/P1. I reviewed her allergies, medications, Medical/Surgical/OB history, and appropriate screenings. Based on history, this is a pregnancy uncomplicated  .   Patient Active Problem List   Diagnosis Date Noted   History of premature rupture of membranes 10/13/2019   History of prior pregnancy with intrauterine growth restricted newborn 10/11/2019   Smoker 09/09/2019   Obesity (BMI 35.0-39.9 without comorbidity) 09/05/2017   Irregular periods/menstrual cycles 09/05/2017    Concerns addressed today  Delivery Plans:  Plans to deliver at [facility]  Anatomy US Explained first scheduled [place] will be around 19 weeks. Anatomy US scheduled N/A  Labs Discussed Avelina Laine genetic screening with patient. Patient ok genetic testing to be drawn at new OB visit. Discussed possible labs to be drawn at new OB appointment.  COVID Vaccine Patient has had COVID vaccine.   Social Determinants of Health Food Insecurity: NO WIC Referral: Patient has WIC.  Transportation: NO Childcare: Discussed no children allowed at ultrasound appointments.   First visit review I reviewed new OB appt with pt. I explained she will have ob bloodwork and pap smear/pelvic exam if indicated. Explained pt will be seen  by Missy at first visit; encounter routed to appropriate provider.  New OB Intake  I connected with  Vanessa Lindsey on 05/21/22 at 10:30 AM EDT by  Video Visit and verified that I am speaking with the correct person using two identifiers. Nurse is located at [facility] and pt is located at home   I discussed the limitations, risks, security and privacy concerns of performing an evaluation and management service by telephone and the availability of in person appointments. I also discussed with the patient that there may be a patient responsible charge related to this service. The patient expressed understanding and agreed to proceed.  I explained I am completing New OB Intake today. We discussed her EDD of 12/23/22 that is based on LMP of 03/18/2022. Pt is G2/P1. I reviewed her allergies, medications, Medical/Surgical/OB history, and appropriate screenings. Based on history, this is an pregnancy uncomplicated.   Patient Active Problem List   Diagnosis Date Noted   History of premature rupture of membranes 10/13/2019   History of prior pregnancy with intrauterine growth restricted newborn 10/11/2019   Smoker 09/09/2019   Obesity (BMI 35.0-39.9 without comorbidity) 09/05/2017   Irregular periods/menstrual cycles 09/05/2017    Concerns addressed today  Delivery Plans:  Plans to deliver at [facility]  Anatomy US Explained first scheduled [place] will be around 19 weeks. Labs Discussed Avelina Laine genetic screening with patient. Patient ok to have  genetic testing to be drawn at new OB visit. Discussed possible labs to be drawn at new OB appointment.  COVID Vaccine Patient has had COVID vaccine.   Social Determinants of Health Food  Insecurity no WIC Referral: Patient has WIC.  Transportation:NO Childcare: Discussed no children allowed at ultrasound appointments.   First visit review I reviewed new OB appt with pt. I explained she will have ob bloodwork and pap  smear/pelvic exam if indicated. Explained pt will be seen by MIssy at first visit; encounter routed to appropriate provider.   Clinical Staff Provider  Office Location  [facility] Dating    Language  English Anatomy US    Flu Vaccine   Genetic Screen  NIPS:   TDaP vaccine    Hgb A1C or  GTT Early : Third trimester :   Covid    LAB RESULTS   Rhogam   Blood Type   Feeding Plan  Antibody    Contraception  Rubella    Circumcision  RPR     Pediatrician   HBsAg     Support Person  HIV    Prenatal Classes  Varicella     GBS  (For PCN allergy, check sensitivities)   BTL Consent  Hep C   VBAC Consent  Pap      Hgb Electro      CF      SMA           Loney Laurence, CMA 05/21/2022  11:06 AM

## 2022-05-21 NOTE — Progress Notes (Signed)
New OB Intake  I connected with  Vanessa Lindsey on 05/21/22 at 10:30 AM EDT by phone call  it and verified that I am speaking with the correct person using two identifiers. Nurse is located at [facility] and pt is located at home.  I discussed the limitations, risks, security and privacy concerns of performing an evaluation and management service by telephone and the availability of in person appointments. I also discussed with the patient that there may be a patient responsible charge related to this service. The patient expressed understanding and agreed to proceed.  I explained I am completing New OB Intake today. We discussed her EDD of 12/23/2022 that is based on LMP of 03/18/22. Pt is G2/P1. I reviewed her allergies, medications, Medical/Surgical/OB history, and appropriate screenings. Based on history, this is a pregnancy uncomplicated  .   Patient Active Problem List   Diagnosis Date Noted   History of premature rupture of membranes 10/13/2019   History of prior pregnancy with intrauterine growth restricted newborn 10/11/2019   Smoker 09/09/2019   Obesity (BMI 35.0-39.9 without comorbidity) 09/05/2017   Irregular periods/menstrual cycles 09/05/2017    Concerns addressed today  Delivery Plans:  Plans to deliver at [facility]  Anatomy US Explained first scheduled [place] will be around 19 weeks. Anatomy US scheduled N/A    COVID Vaccine Patient has had COVID vaccine.   Social Determinants of Health Food Insecurity: NO WIC Referral: Patient has WIC.  Transportation: NO Childcare: Discussed no children allowed at ultrasound appointments.   First visit review I reviewed new OB appt with pt. I explained she will have ob bloodwork and pap smear/pelvic exam if indicated. Explained pt will be seen by Missy at first visit; encounter routed to appropriate provider.  New OB Intake  I connected with  Vanessa Lindsey on 05/21/22 at 10:30 AM EDT by  Video Visit  and verified that I am speaking with the correct person using two identifiers. Nurse is located at [facility] and pt is located at home   I discussed the limitations, risks, security and privacy concerns of performing an evaluation and management service by telephone and the availability of in person appointments. I also discussed with the patient that there may be a patient responsible charge related to this service. The patient expressed understanding and agreed to proceed.  I explained I am completing New OB Intake today. We discussed her EDD of 12/23/22 that is based on LMP of 03/18/2022. Pt is G2/P1. I reviewed her allergies, medications, Medical/Surgical/OB history, and appropriate screenings. Based on history, this is an pregnancy uncomplicated.   Patient Active Problem List   Diagnosis Date Noted   History of premature rupture of membranes 10/13/2019   History of prior pregnancy with intrauterine growth restricted newborn 10/11/2019   Smoker 09/09/2019   Obesity (BMI 35.0-39.9 without comorbidity) 09/05/2017   Irregular periods/menstrual cycles 09/05/2017    Concerns addressed today  Delivery Plans:  Plans to deliver at [facility]    COVID Vaccine Patient has had COVID vaccine.   Social Determinants of Health Food Insecurity no WIC Referral: Patient has WIC.  Transportation:NO Childcare: Discussed no children allowed at ultrasound appointments.   First visit review I reviewed new OB appt with pt. I explained she will have ob bloodwork and pap  Loney Laurence, CMA 05/21/2022  11:06 AM Loney Laurence, CMA 05/21/2022  10:36 AM

## 2022-05-24 ENCOUNTER — Other Ambulatory Visit: Payer: Medicaid Other

## 2022-05-24 DIAGNOSIS — E669 Obesity, unspecified: Secondary | ICD-10-CM | POA: Diagnosis not present

## 2022-05-24 DIAGNOSIS — E7800 Pure hypercholesterolemia, unspecified: Secondary | ICD-10-CM | POA: Diagnosis not present

## 2022-05-24 DIAGNOSIS — Z113 Encounter for screening for infections with a predominantly sexual mode of transmission: Secondary | ICD-10-CM | POA: Diagnosis not present

## 2022-05-24 DIAGNOSIS — E039 Hypothyroidism, unspecified: Secondary | ICD-10-CM | POA: Diagnosis not present

## 2022-05-24 DIAGNOSIS — Z3480 Encounter for supervision of other normal pregnancy, unspecified trimester: Secondary | ICD-10-CM | POA: Diagnosis not present

## 2022-05-24 DIAGNOSIS — E559 Vitamin D deficiency, unspecified: Secondary | ICD-10-CM | POA: Diagnosis not present

## 2022-05-25 ENCOUNTER — Telehealth: Payer: Self-pay | Admitting: Obstetrics

## 2022-05-25 LAB — URINALYSIS, ROUTINE W REFLEX MICROSCOPIC
Bilirubin, UA: NEGATIVE
Glucose, UA: NEGATIVE
Ketones, UA: NEGATIVE
Nitrite, UA: NEGATIVE
Protein,UA: NEGATIVE
RBC, UA: NEGATIVE
Specific Gravity, UA: 1.022 (ref 1.005–1.030)
Urobilinogen, Ur: 0.2 mg/dL (ref 0.2–1.0)
pH, UA: 5.5 (ref 5.0–7.5)

## 2022-05-25 LAB — HCV INTERPRETATION

## 2022-05-25 LAB — ANTIBODY SCREEN: Antibody Screen: NEGATIVE

## 2022-05-25 LAB — MICROSCOPIC EXAMINATION
Casts: NONE SEEN /lpf
Epithelial Cells (non renal): >10 /hpf — AB (ref 0–10)
RBC, Urine: NONE SEEN /hpf (ref 0–2)

## 2022-05-25 LAB — RPR: RPR Ser Ql: NONREACTIVE

## 2022-05-25 LAB — VIRAL HEPATITIS HBV, HCV
HCV Ab: NONREACTIVE
Hep B Core Total Ab: NEGATIVE
Hep B Surface Ab, Qual: REACTIVE
Hepatitis B Surface Ag: NEGATIVE

## 2022-05-25 LAB — VARICELLA ZOSTER ANTIBODY, IGG: Varicella zoster IgG: 1340 index (ref >165)

## 2022-05-25 LAB — HIV ANTIBODY (ROUTINE TESTING W REFLEX): HIV Screen 4th Generation wRfx: NONREACTIVE

## 2022-05-25 LAB — ABO AND RH: Rh Factor: POSITIVE

## 2022-05-25 LAB — HEMOGLOBIN A1C
Est. average glucose Bld gHb Est-mCnc: 114 mg/dL
Hgb A1c MFr Bld: 5.6 % (ref 4.8–5.6)

## 2022-05-25 LAB — TSH: TSH: 3.26 u[IU]/mL (ref 0.450–4.500)

## 2022-05-25 LAB — RUBELLA SCREEN: Rubella Antibodies, IGG: 1.16 index (ref >0.99)

## 2022-05-25 NOTE — Telephone Encounter (Signed)
PT would like the results from her resent labs explained. Please return call.

## 2022-05-26 LAB — URINE CULTURE, OB REFLEX

## 2022-05-26 LAB — CULTURE, OB URINE

## 2022-05-28 LAB — GC/CHLAMYDIA PROBE AMP
Chlamydia trachomatis, NAA: NEGATIVE
Neisseria Gonorrhoeae by PCR: NEGATIVE

## 2022-05-29 ENCOUNTER — Encounter: Payer: Self-pay | Admitting: Obstetrics

## 2022-05-29 ENCOUNTER — Other Ambulatory Visit: Payer: Self-pay | Admitting: Obstetrics

## 2022-05-30 ENCOUNTER — Telehealth: Payer: Self-pay | Admitting: Obstetrics

## 2022-05-30 NOTE — Telephone Encounter (Signed)
LM with pt in regards to concerns per office protocol.

## 2022-05-30 NOTE — Telephone Encounter (Signed)
Pt called stating that she has been having yellow discharge and itching for 2 days think it is yeast and is requesting rx sent. Please advise.

## 2022-05-31 ENCOUNTER — Other Ambulatory Visit: Payer: Self-pay | Admitting: Certified Nurse Midwife

## 2022-05-31 MED ORDER — MICONAZOLE NITRATE 100 MG VA SUPP: 100.0000 mg | Freq: Every day | VAGINAL | 0 refills | Status: DC

## 2022-06-02 LAB — MONITOR DRUG PROFILE 14(MW)
Amphetamine Scrn, Ur: NEGATIVE ng/mL
BARBITURATE SCREEN URINE: NEGATIVE ng/mL
BENZODIAZEPINE SCREEN, URINE: NEGATIVE ng/mL
Buprenorphine, Urine: NEGATIVE ng/mL
Cocaine (Metab) Scrn, Ur: NEGATIVE ng/mL
Creatinine(Crt), U: 102.9 mg/dL (ref 20.0–300.0)
Fentanyl, Urine: NEGATIVE pg/mL
Meperidine Screen, Urine: NEGATIVE ng/mL
Methadone Screen, Urine: NEGATIVE ng/mL
OXYCODONE+OXYMORPHONE UR QL SCN: NEGATIVE ng/mL
Opiate Scrn, Ur: NEGATIVE ng/mL
Ph of Urine: 5.5 (ref 4.5–8.9)
Phencyclidine Qn, Ur: NEGATIVE ng/mL
Propoxyphene Scrn, Ur: NEGATIVE ng/mL
SPECIFIC GRAVITY: 1.024
Tramadol Screen, Urine: NEGATIVE ng/mL

## 2022-06-02 LAB — NICOTINE SCREEN, URINE: Cotinine Ql Scrn, Ur: POSITIVE ng/mL — AB

## 2022-06-02 LAB — CANNABINOID (GC/MS), URINE
Cannabinoid: POSITIVE — AB
Carboxy THC (GC/MS): 11 ng/mL

## 2022-06-06 ENCOUNTER — Encounter: Payer: Medicaid Other | Admitting: Obstetrics

## 2022-06-06 DIAGNOSIS — Z3A12 12 weeks gestation of pregnancy: Secondary | ICD-10-CM

## 2022-06-07 ENCOUNTER — Other Ambulatory Visit: Payer: Self-pay

## 2022-06-07 ENCOUNTER — Emergency Department: Payer: Medicaid Other

## 2022-06-07 DIAGNOSIS — Y92410 Unspecified street and highway as the place of occurrence of the external cause: Secondary | ICD-10-CM | POA: Insufficient documentation

## 2022-06-07 DIAGNOSIS — J45909 Unspecified asthma, uncomplicated: Secondary | ICD-10-CM | POA: Insufficient documentation

## 2022-06-07 DIAGNOSIS — M25512 Pain in left shoulder: Secondary | ICD-10-CM | POA: Diagnosis not present

## 2022-06-07 DIAGNOSIS — R0789 Other chest pain: Secondary | ICD-10-CM | POA: Diagnosis not present

## 2022-06-07 DIAGNOSIS — M549 Dorsalgia, unspecified: Secondary | ICD-10-CM | POA: Insufficient documentation

## 2022-06-07 DIAGNOSIS — R1030 Lower abdominal pain, unspecified: Secondary | ICD-10-CM | POA: Diagnosis not present

## 2022-06-07 DIAGNOSIS — Z3A12 12 weeks gestation of pregnancy: Secondary | ICD-10-CM | POA: Diagnosis not present

## 2022-06-07 DIAGNOSIS — O9A211 Injury, poisoning and certain other consequences of external causes complicating pregnancy, first trimester: Secondary | ICD-10-CM | POA: Diagnosis not present

## 2022-06-07 DIAGNOSIS — R109 Unspecified abdominal pain: Secondary | ICD-10-CM | POA: Diagnosis not present

## 2022-06-07 DIAGNOSIS — O26891 Other specified pregnancy related conditions, first trimester: Secondary | ICD-10-CM | POA: Diagnosis not present

## 2022-06-07 DIAGNOSIS — R079 Chest pain, unspecified: Secondary | ICD-10-CM | POA: Diagnosis not present

## 2022-06-07 DIAGNOSIS — Z041 Encounter for examination and observation following transport accident: Secondary | ICD-10-CM | POA: Diagnosis not present

## 2022-06-07 DIAGNOSIS — O99411 Diseases of the circulatory system complicating pregnancy, first trimester: Secondary | ICD-10-CM | POA: Diagnosis not present

## 2022-06-07 LAB — CBC WITH DIFFERENTIAL/PLATELET
Abs Immature Granulocytes: 0.05 10*3/uL (ref 0.00–0.07)
Basophils Absolute: 0.1 10*3/uL (ref 0.0–0.1)
Basophils Relative: 1 %
Eosinophils Absolute: 0.2 10*3/uL (ref 0.0–0.5)
Eosinophils Relative: 2 %
HCT: 37 % (ref 36.0–46.0)
Hemoglobin: 11.5 g/dL — ABNORMAL LOW (ref 12.0–15.0)
Immature Granulocytes: 0 %
Lymphocytes Relative: 22 %
Lymphs Abs: 2.9 10*3/uL (ref 0.7–4.0)
MCH: 26.1 pg (ref 26.0–34.0)
MCHC: 31.1 g/dL (ref 30.0–36.0)
MCV: 84.1 fL (ref 80.0–100.0)
Monocytes Absolute: 0.5 10*3/uL (ref 0.1–1.0)
Monocytes Relative: 4 %
Neutro Abs: 9.3 10*3/uL — ABNORMAL HIGH (ref 1.7–7.7)
Neutrophils Relative %: 71 %
Platelets: 327 10*3/uL (ref 150–400)
RBC: 4.4 MIL/uL (ref 3.87–5.11)
RDW: 13.9 % (ref 11.5–15.5)
WBC: 13.1 10*3/uL — ABNORMAL HIGH (ref 4.0–10.5)
nRBC: 0 % (ref 0.0–0.2)

## 2022-06-07 LAB — URINALYSIS, ROUTINE W REFLEX MICROSCOPIC
Bilirubin Urine: NEGATIVE
Glucose, UA: NEGATIVE mg/dL
Hgb urine dipstick: NEGATIVE
Ketones, ur: NEGATIVE mg/dL
Leukocytes,Ua: NEGATIVE
Nitrite: NEGATIVE
Protein, ur: NEGATIVE mg/dL
Specific Gravity, Urine: 1.017 (ref 1.005–1.030)
pH: 6 (ref 5.0–8.0)

## 2022-06-07 LAB — COMPREHENSIVE METABOLIC PANEL
ALT: 13 U/L (ref 0–44)
AST: 13 U/L — ABNORMAL LOW (ref 15–41)
Albumin: 3.4 g/dL — ABNORMAL LOW (ref 3.5–5.0)
Alkaline Phosphatase: 91 U/L (ref 38–126)
Anion gap: 5 (ref 5–15)
BUN: 7 mg/dL (ref 6–20)
CO2: 24 mmol/L (ref 22–32)
Calcium: 8.6 mg/dL — ABNORMAL LOW (ref 8.9–10.3)
Chloride: 108 mmol/L (ref 98–111)
Creatinine, Ser: 0.56 mg/dL (ref 0.44–1.00)
GFR, Estimated: >60 mL/min (ref >60)
Glucose, Bld: 122 mg/dL — ABNORMAL HIGH (ref 70–99)
Potassium: 3.5 mmol/L (ref 3.5–5.1)
Sodium: 137 mmol/L (ref 135–145)
Total Bilirubin: 0.6 mg/dL (ref 0.3–1.2)
Total Protein: 6.8 g/dL (ref 6.5–8.1)

## 2022-06-07 LAB — PREGNANCY, URINE: Preg Test, Ur: POSITIVE — AB

## 2022-06-07 LAB — HCG, QUANTITATIVE, PREGNANCY: hCG, Beta Chain, Quant, S: 85524 m[IU]/mL — ABNORMAL HIGH (ref <5)
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
## 2022-06-08 ENCOUNTER — Emergency Department
Admission: EM | Admit: 2022-06-08 | Discharge: 2022-06-08 | Disposition: A | Discharge status: Home / Self Care | Payer: Medicaid Other | Attending: Emergency Medicine | Admitting: Emergency Medicine

## 2022-06-08 ENCOUNTER — Emergency Department: Payer: Medicaid Other

## 2022-06-08 ENCOUNTER — Telehealth: Payer: Self-pay | Admitting: Obstetrics

## 2022-06-08 DIAGNOSIS — O26891 Other specified pregnancy related conditions, first trimester: Secondary | ICD-10-CM | POA: Diagnosis not present

## 2022-06-08 DIAGNOSIS — Z3A12 12 weeks gestation of pregnancy: Secondary | ICD-10-CM | POA: Diagnosis not present

## 2022-06-08 DIAGNOSIS — M25512 Pain in left shoulder: Secondary | ICD-10-CM | POA: Diagnosis not present

## 2022-06-08 DIAGNOSIS — Z041 Encounter for examination and observation following transport accident: Secondary | ICD-10-CM | POA: Diagnosis not present

## 2022-06-08 DIAGNOSIS — R1030 Lower abdominal pain, unspecified: Secondary | ICD-10-CM | POA: Diagnosis not present

## 2022-06-08 MED ORDER — ACETAMINOPHEN 500 MG PO TABS
1000.0000 mg | ORAL_TABLET | Freq: Once | ORAL | Status: AC
  Administered 2022-06-08: 1000 mg via ORAL
  Filled 2022-06-08: qty 2

## 2022-06-08 NOTE — Discharge Instructions (Addendum)
You may take Tylenol 1000 mg every 6 hours as needed for pain.  Your lab work, urine, EKG today were reassuring.  OB ultrasound was also normal with normal fetal heart rate.  X-rays of your chest and shoulder showed no fracture or dislocation.
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
## 2022-06-11 ENCOUNTER — Telehealth: Payer: Self-pay

## 2022-06-11 NOTE — Telephone Encounter (Signed)
Transition Care Management Follow-up Telephone Call Date of discharge and from where: 06/08/2022 from [facility] How have you been since you were released from the hospital? Patient stated that she is still having pain in her neck and is now having pain in her back radiating to the coccyx.  Any questions or concerns? No  Items Reviewed: Did the pt receive and understand the discharge instructions provided? Yes  Medications obtained and verified? Yes  Other? No  Any new allergies since your discharge? No  Dietary orders reviewed? No Do you have support at home? Yes   Functional Questionnaire: (I = Independent and D = Dependent) ADLs: I  Bathing/Dressing- I  Meal Prep- I  Eating- I  Maintaining continence- I  Transferring/Ambulation- I  Managing Meds- I   Follow up appointments reviewed:  PCP Hospital f/u appt confirmed? No   Specialist Hospital f/u appt confirmed? No  Encouraged patient to reach out to OBGYN.  Are transportation arrangements needed? No  If their condition worsens, is the pt aware to call PCP or go to the Emergency Dept.? Yes Was the patient provided with contact information for the PCP's office or ED? Yes Was to pt encouraged to call back with questions or concerns? Yes

## 2022-07-25 ENCOUNTER — Telehealth: Payer: Self-pay | Admitting: Obstetrics and Gynecology

## 2022-07-25 ENCOUNTER — Ambulatory Visit (INDEPENDENT_AMBULATORY_CARE_PROVIDER_SITE_OTHER): Payer: Medicaid Other | Admitting: Obstetrics and Gynecology

## 2022-07-25 ENCOUNTER — Other Ambulatory Visit (HOSPITAL_COMMUNITY)
Admission: RE | Admit: 2022-07-25 | Discharge: 2022-07-25 | Disposition: A | Discharge status: Home / Self Care | Payer: Medicaid Other | Source: Ambulatory Visit | Attending: Obstetrics and Gynecology | Admitting: Obstetrics and Gynecology

## 2022-07-25 VITALS — BP 102/60 | HR 95 | Wt 214.8 lb

## 2022-07-25 DIAGNOSIS — Z3492 Encounter for supervision of normal pregnancy, unspecified, second trimester: Secondary | ICD-10-CM | POA: Diagnosis present

## 2022-07-25 DIAGNOSIS — M549 Dorsalgia, unspecified: Secondary | ICD-10-CM

## 2022-07-25 DIAGNOSIS — Z3689 Encounter for other specified antenatal screening: Secondary | ICD-10-CM

## 2022-07-25 DIAGNOSIS — Z1379 Encounter for other screening for genetic and chromosomal anomalies: Secondary | ICD-10-CM

## 2022-07-25 DIAGNOSIS — Z3A19 19 weeks gestation of pregnancy: Secondary | ICD-10-CM | POA: Diagnosis not present

## 2022-07-25 DIAGNOSIS — R87610 Atypical squamous cells of undetermined significance on cytologic smear of cervix (ASC-US): Secondary | ICD-10-CM | POA: Insufficient documentation

## 2022-07-25 DIAGNOSIS — O99891 Other specified diseases and conditions complicating pregnancy: Secondary | ICD-10-CM

## 2022-07-25 DIAGNOSIS — Z720 Tobacco use: Secondary | ICD-10-CM

## 2022-07-25 DIAGNOSIS — O0932 Supervision of pregnancy with insufficient antenatal care, second trimester: Secondary | ICD-10-CM

## 2022-07-25 DIAGNOSIS — Z8759 Personal history of other complications of pregnancy, childbirth and the puerperium: Secondary | ICD-10-CM

## 2022-07-25 DIAGNOSIS — F1290 Cannabis use, unspecified, uncomplicated: Secondary | ICD-10-CM

## 2022-07-25 DIAGNOSIS — E66813 Obesity, class 3: Secondary | ICD-10-CM

## 2022-07-25 LAB — POCT URINALYSIS DIPSTICK OB
Bilirubin, UA: NEGATIVE
Blood, UA: NEGATIVE
Glucose, UA: NEGATIVE
Ketones, UA: NEGATIVE
Leukocytes, UA: NEGATIVE
Nitrite, UA: NEGATIVE
POC,PROTEIN,UA: NEGATIVE
Spec Grav, UA: 1.015 (ref 1.010–1.025)
Urobilinogen, UA: 0.2 E.U./dL
pH, UA: 6 (ref 5.0–8.0)

## 2022-07-25 NOTE — Telephone Encounter (Signed)
Pt requesting prenatal gummies Vit-Fe Fumarate-FA  sent to [facility] on [address].
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
## 2022-07-25 NOTE — Progress Notes (Unsigned)
ROB. Patient states  Patient states that when she is walking or exerts her self with heavy lifting she experiences pain in her pelvis.

## 2022-07-26 ENCOUNTER — Encounter: Payer: Self-pay | Admitting: Obstetrics and Gynecology

## 2022-07-26 MED ORDER — VITAFOL GUMMIES 3.33-0.333-34.8 MG PO CHEW: 1.0000 | CHEWABLE_TABLET | Freq: Every day | ORAL | 3 refills | Status: DC

## 2022-07-26 MED ORDER — PRENATAL MULTIVITAMIN CH: 3.0000 | ORAL_TABLET | Freq: Every day | ORAL | 4 refills | Status: DC

## 2022-07-26 MED ORDER — ASPIRIN 81 MG PO TBEC: 81.0000 mg | DELAYED_RELEASE_TABLET | Freq: Every day | ORAL | 2 refills | Status: DC

## 2022-07-26 MED ORDER — CYCLOBENZAPRINE HCL 10 MG PO TABS: 10.0000 mg | ORAL_TABLET | Freq: Three times a day (TID) | ORAL | 1 refills | Status: DC | PRN

## 2022-07-26 NOTE — Addendum Note (Signed)
Addended by: Fabian November on: 07/26/2022 11:15 AM   Modules accepted: Orders

## 2022-07-27 ENCOUNTER — Other Ambulatory Visit: Payer: Self-pay

## 2022-07-27 LAB — AFP, SERUM, OPEN SPINA BIFIDA
AFP MoM: 0.72
AFP Value: 30.6 ng/mL
Gest. Age on Collection Date: 19.2 wk
Maternal Age At EDD: 28.8 a
OSBR Risk 1 IN: 10000
Test Results:: NEGATIVE
Weight: 215 [lb_av]

## 2022-07-27 MED ORDER — VITAFOL GUMMIES 3.33-0.333-34.8 MG PO CHEW: 1.0000 | CHEWABLE_TABLET | Freq: Every day | ORAL | 3 refills | Status: DC

## 2022-07-27 NOTE — Telephone Encounter (Signed)
RX sent to requested pharmacy. 

## 2022-07-29 ENCOUNTER — Encounter: Payer: Self-pay | Admitting: Obstetrics and Gynecology

## 2022-07-29 DIAGNOSIS — Z3A19 19 weeks gestation of pregnancy: Secondary | ICD-10-CM

## 2022-07-29 LAB — MATERNIT21  PLUS CORE+ESS+SCA, BLOOD

## 2022-07-30 ENCOUNTER — Other Ambulatory Visit: Payer: Self-pay

## 2022-07-30 MED ORDER — VITAFOL GUMMIES 3.33-0.333-34.8 MG PO CHEW: 3.0000 | CHEWABLE_TABLET | Freq: Every day | ORAL | 3 refills | Status: DC

## 2022-07-30 MED ORDER — PRENATAL MULTIVITAMIN CH: 3.0000 | ORAL_TABLET | Freq: Every day | ORAL | 4 refills | Status: DC

## 2022-07-31 LAB — CYTOLOGY - PAP
Comment: NEGATIVE
Diagnosis: UNDETERMINED — AB
High risk HPV: POSITIVE — AB

## 2022-08-01 ENCOUNTER — Encounter: Payer: Self-pay | Admitting: Obstetrics and Gynecology

## 2022-08-01 DIAGNOSIS — R87610 Atypical squamous cells of undetermined significance on cytologic smear of cervix (ASC-US): Secondary | ICD-10-CM | POA: Insufficient documentation

## 2022-08-01 NOTE — Telephone Encounter (Signed)
Error

## 2022-08-06 ENCOUNTER — Ambulatory Visit (INDEPENDENT_AMBULATORY_CARE_PROVIDER_SITE_OTHER): Payer: Medicaid Other

## 2022-08-06 DIAGNOSIS — Z3689 Encounter for other specified antenatal screening: Secondary | ICD-10-CM | POA: Diagnosis not present

## 2022-08-07 ENCOUNTER — Telehealth: Payer: Self-pay | Admitting: Obstetrics and Gynecology

## 2022-08-07 NOTE — Telephone Encounter (Signed)
Patient is calling to speak with someone about her test results. Please advise?

## 2022-08-10 NOTE — Telephone Encounter (Signed)
Called patient no answer. LVM for her to return call in regards to lab results.

## 2022-08-20 ENCOUNTER — Ambulatory Visit (INDEPENDENT_AMBULATORY_CARE_PROVIDER_SITE_OTHER): Payer: Medicaid Other | Admitting: Certified Nurse Midwife

## 2022-08-20 VITALS — BP 100/68 | HR 90 | Wt 219.4 lb

## 2022-08-20 DIAGNOSIS — Z3A23 23 weeks gestation of pregnancy: Secondary | ICD-10-CM

## 2022-08-20 NOTE — Progress Notes (Signed)
Rob doing well, feeling movement. Discussed glucose screen next visit. Information sheet given to pt on how to eat prior to visi. She verbalizes and agrees. Anatomy u/s incomplete. rept ordered. Follow up 1-2 wk for u/s , 4 wks for glucose screen and rob.   Doreene Burke, CM

## 2022-08-20 NOTE — Patient Instructions (Signed)
Round Ligament Pain  The round ligaments are a pair of cord-like tissues that help support the uterus. They can become a source of pain during pregnancy as the ligaments soften and stretch as the baby grows. The pain usually begins in the second trimester (13-28 weeks) of pregnancy, and should only last for a few seconds when it occurs. However, the pain can come and go until the baby is delivered. The pain does not cause harm to the baby. Round ligament pain is usually a short, sharp, and pinching pain, but it can also be a dull, lingering, and aching pain. The pain is felt in the lower side of the abdomen or in the groin. It usually starts deep in the groin and moves up to the outside of the hip area. The pain may happen when you: Suddenly change position, such as quickly going from a sitting to standing position. Do physical activity. Cough or sneeze. Follow these instructions at home: Managing pain  When the pain starts, relax. Then, try any of these methods to help with the pain: Sit down. Flex your knees up to your abdomen. Lie on your side with one pillow under your abdomen and another pillow between your legs. Sit in a warm bath for 15-20 minutes or until the pain goes away. General instructions Watch your condition for any changes. Move slowly when you sit down or stand up. Stop or reduce your physical activities if they cause pain. Avoid long walks if they cause pain. Take over-the-counter and prescription medicines only as told by your health care provider. Keep all follow-up visits. This is important. Contact a health care provider if: Your pain does not go away with treatment. You feel pain in your back that you did not have before. Your medicine is not helping. You have a fever or chills. You have nausea or vomiting. You have diarrhea. You have pain when you urinate. Get help right away if: You have pain that is a rhythmic, cramping pain similar to labor pains. Labor  pains are usually 2 minutes apart, last for about 1 minute, and involve a bearing down feeling or pressure in your pelvis. You have vaginal bleeding. These symptoms may represent a serious problem that is an emergency. Do not wait to see if the symptoms will go away. Get medical help right away. Call your local emergency services (911 in the [place] Do not drive yourself to the hospital. Summary Round ligament pain is felt in the lower abdomen or groin. This pain usually begins in the second trimester (13-28 weeks) and should only last for a few seconds when it occurs. You may notice the pain when you suddenly change position, when you cough or sneeze, or during physical activity. Relaxing, flexing your knees to your abdomen, lying on one side, or taking a warm bath may help to get rid of the pain. Contact your health care provider if the pain does not go away. This information is not intended to replace advice given to you by your health care provider. Make sure you discuss any questions you have with your health care provider. Document Revised: 03/01/2021 Document Reviewed: 03/01/2021 Elsevier Patient Education  2023 Elsevier Inc.  

## 2022-08-29 ENCOUNTER — Ambulatory Visit (INDEPENDENT_AMBULATORY_CARE_PROVIDER_SITE_OTHER): Payer: Medicaid Other

## 2022-08-29 DIAGNOSIS — Z3482 Encounter for supervision of other normal pregnancy, second trimester: Secondary | ICD-10-CM | POA: Diagnosis not present

## 2022-08-29 DIAGNOSIS — Z3A23 23 weeks gestation of pregnancy: Secondary | ICD-10-CM | POA: Diagnosis not present

## 2022-09-12 DIAGNOSIS — R5383 Other fatigue: Secondary | ICD-10-CM | POA: Diagnosis not present

## 2022-09-12 DIAGNOSIS — Z111 Encounter for screening for respiratory tuberculosis: Secondary | ICD-10-CM | POA: Diagnosis not present

## 2022-09-12 DIAGNOSIS — M069 Rheumatoid arthritis, unspecified: Secondary | ICD-10-CM | POA: Diagnosis not present

## 2022-09-12 DIAGNOSIS — Z01419 Encounter for gynecological examination (general) (routine) without abnormal findings: Secondary | ICD-10-CM | POA: Diagnosis not present

## 2022-09-12 DIAGNOSIS — E039 Hypothyroidism, unspecified: Secondary | ICD-10-CM | POA: Diagnosis not present

## 2022-09-18 ENCOUNTER — Other Ambulatory Visit: Payer: Medicaid Other

## 2022-09-18 ENCOUNTER — Ambulatory Visit (INDEPENDENT_AMBULATORY_CARE_PROVIDER_SITE_OTHER): Payer: Medicaid Other | Admitting: Certified Nurse Midwife

## 2022-09-18 ENCOUNTER — Encounter: Payer: Self-pay | Admitting: Certified Nurse Midwife

## 2022-09-18 VITALS — BP 105/69 | HR 92 | Wt 225.3 lb

## 2022-09-18 DIAGNOSIS — Z3493 Encounter for supervision of normal pregnancy, unspecified, third trimester: Secondary | ICD-10-CM | POA: Diagnosis not present

## 2022-09-18 DIAGNOSIS — Z3A27 27 weeks gestation of pregnancy: Secondary | ICD-10-CM

## 2022-09-18 DIAGNOSIS — Z23 Encounter for immunization: Secondary | ICD-10-CM

## 2022-09-18 DIAGNOSIS — Z3A28 28 weeks gestation of pregnancy: Secondary | ICD-10-CM

## 2022-09-18 MED ORDER — TETANUS-DIPHTH-ACELL PERTUSSIS 5-2.5-18.5 LF-MCG/0.5 IM SUSY
0.5000 mL | PREFILLED_SYRINGE | Freq: Once | INTRAMUSCULAR | Status: AC
  Administered 2022-09-18: 0.5 mL via INTRAMUSCULAR

## 2022-09-18 NOTE — Patient Instructions (Signed)
Oral Glucose Tolerance Test During Pregnancy Why am I having this test? The oral glucose tolerance test (OGTT) is done to check how your body processes blood sugar (glucose). This is one of several tests used to diagnose diabetes that develops during pregnancy (gestational diabetes mellitus). Gestational diabetes is a short-term form of diabetes that some women develop while they are pregnant. It usually occurs during the second trimester of pregnancy and goes away after delivery. Testing, or screening, for gestational diabetes usually occurs at weeks 24-28 of pregnancy. You may have the OGTT test after having a 1-hour glucose screening test if the results from that test indicate that you may have gestational diabetes. This test may also be needed if: You have a history of gestational diabetes. There is a history of giving birth to very large babies or of losing pregnancies (having stillbirths). You have signs and symptoms of diabetes, such as: Changes in your eyesight. Tingling or numbness in your hands or feet. Changes in hunger, thirst, and urination, and these are not explained by your pregnancy. What is being tested? This test measures the amount of glucose in your blood at different times during a period of 3 hours. This shows how well your body can process glucose. What kind of sample is taken?  Blood samples are required for this test. They are usually collected by inserting a needle into a blood vessel. How do I prepare for this test? For 3 days before your test, eat normally. Have plenty of carbohydrate-rich foods. Follow instructions from your health care provider about: Eating or drinking restrictions on the day of the test. You may be asked not to eat or drink anything other than water (to fast) starting 8-10 hours before the test. Changing or stopping your regular medicines. Some medicines may interfere with this test. Tell a health care provider about: All medicines you are  taking, including vitamins, herbs, eye drops, creams, and over-the-counter medicines. Any blood disorders you have. Any surgeries you have had. Any medical conditions you have. What happens during the test? First, your blood glucose will be measured. This is referred to as your fasting blood glucose because you fasted before the test. Then, you will drink a glucose solution that contains a certain amount of glucose. Your blood glucose will be measured again 1, 2, and 3 hours after you drink the solution. This test takes about 3 hours to complete. You will need to stay at the testing location during this time. During the testing period: Do not eat or drink anything other than the glucose solution. Do not exercise. Do not use any products that contain nicotine or tobacco, such as cigarettes, e-cigarettes, and chewing tobacco. These can affect your test results. If you need help quitting, ask your health care provider. The testing procedure may vary among health care providers and hospitals. How are the results reported? Your results will be reported as milligrams of glucose per deciliter of blood (mg/dL) or millimoles per liter (mmol/L). There is more than one source for screening and diagnosis reference values used to diagnose gestational diabetes. Your health care provider will compare your results to normal values that were established after testing a large group of people (reference values). Reference values may vary among labs and hospitals. For this test (Carpenter-Coustan), reference values are: Fasting: 95 mg/dL (5.3 mmol/L). 1 hour: 180 mg/dL (10.0 mmol/L). 2 hour: 155 mg/dL (8.6 mmol/L). 3 hour: 140 mg/dL (7.8 mmol/L). What do the results mean? Results below the reference values are   considered normal. If two or more of your blood glucose levels are at or above the reference values, you may be diagnosed with gestational diabetes. If only one level is high, your health care provider may  suggest repeat testing or other tests to confirm a diagnosis. Talk with your health care provider about what your results mean. Questions to ask your health care provider Ask your health care provider, or the department that is doing the test: When will my results be ready? How will I get my results? What are my treatment options? What other tests do I need? What are my next steps? Summary The oral glucose tolerance test (OGTT) is one of several tests used to diagnose diabetes that develops during pregnancy (gestational diabetes mellitus). Gestational diabetes is a short-term form of diabetes that some women develop while they are pregnant. You may have the OGTT test after having a 1-hour glucose screening test if the results from that test show that you may have gestational diabetes. You may also have this test if you have any symptoms or risk factors for this type of diabetes. Talk with your health care provider about what your results mean. This information is not intended to replace advice given to you by your health care provider. Make sure you discuss any questions you have with your health care provider. Document Revised: 05/26/2020 Document Reviewed: 05/26/2020 Elsevier Patient Education  2023 Elsevier Inc.  

## 2022-09-18 NOTE — Progress Notes (Signed)
ROB doing well. Feels good movement. 28 wk labs today: Glucose screen/RPR/CBC. Tdap, Blood transfusion consent completed, all questions answered. Ready set baby reviewed, see check list for topics covered. Sample birth plan given, will follow up in upcoming visits. Discussed birth control after delivery, information pamphlet given.   Body mass index is 42.57 kg/m. Pt need growth u/s for BMI ,orders placed.  Pt requesting note for work due to back pain , note given.   Follow up 2 wk with Missy  for ROB or sooner if needed.    Philip Aspen, CNM

## 2022-09-19 ENCOUNTER — Other Ambulatory Visit: Payer: Self-pay | Admitting: Certified Nurse Midwife

## 2022-09-19 ENCOUNTER — Encounter: Payer: Self-pay | Admitting: Certified Nurse Midwife

## 2022-09-19 DIAGNOSIS — R7309 Other abnormal glucose: Secondary | ICD-10-CM

## 2022-09-19 LAB — CBC
Hematocrit: 31.6 % — ABNORMAL LOW (ref 34.0–46.6)
Hemoglobin: 10 g/dL — ABNORMAL LOW (ref 11.1–15.9)
MCH: 26.2 pg — ABNORMAL LOW (ref 26.6–33.0)
MCHC: 31.6 g/dL (ref 31.5–35.7)
MCV: 83 fL (ref 79–97)
Platelets: 304 10*3/uL (ref 150–450)
RBC: 3.81 x10E6/uL (ref 3.77–5.28)
RDW: 13.3 % (ref 11.7–15.4)
WBC: 13.5 10*3/uL — ABNORMAL HIGH (ref 3.4–10.8)

## 2022-09-19 LAB — RPR: RPR Ser Ql: NONREACTIVE

## 2022-09-19 LAB — GLUCOSE, 1 HOUR GESTATIONAL: Gestational Diabetes Screen: 167 mg/dL — ABNORMAL HIGH (ref 70–139)

## 2022-09-19 MED ORDER — FUSION PLUS PO CAPS: 1.0000 | ORAL_CAPSULE | Freq: Every day | ORAL | 9 refills | Status: DC

## 2022-09-21 ENCOUNTER — Encounter: Payer: Self-pay | Admitting: Certified Nurse Midwife

## 2022-09-21 ENCOUNTER — Other Ambulatory Visit: Payer: Self-pay

## 2022-09-21 DIAGNOSIS — R7309 Other abnormal glucose: Secondary | ICD-10-CM

## 2022-09-24 ENCOUNTER — Telehealth: Payer: Self-pay | Admitting: Certified Nurse Midwife

## 2022-09-24 ENCOUNTER — Ambulatory Visit (INDEPENDENT_AMBULATORY_CARE_PROVIDER_SITE_OTHER): Payer: Medicaid Other

## 2022-09-24 ENCOUNTER — Other Ambulatory Visit: Payer: Self-pay

## 2022-09-24 DIAGNOSIS — Z3A27 27 weeks gestation of pregnancy: Secondary | ICD-10-CM | POA: Diagnosis not present

## 2022-09-24 DIAGNOSIS — Z3A26 26 weeks gestation of pregnancy: Secondary | ICD-10-CM | POA: Diagnosis not present

## 2022-09-24 DIAGNOSIS — Z3482 Encounter for supervision of other normal pregnancy, second trimester: Secondary | ICD-10-CM | POA: Diagnosis not present

## 2022-09-24 MED ORDER — VITAFOL GUMMIES 3.33-0.333-34.8 MG PO CHEW: 3.0000 | CHEWABLE_TABLET | Freq: Every day | ORAL | 3 refills | Status: DC

## 2022-09-24 MED ORDER — FUSION PLUS PO CAPS: 1.0000 | ORAL_CAPSULE | Freq: Every day | ORAL | 9 refills | Status: DC

## 2022-09-24 NOTE — Telephone Encounter (Signed)
Rx sent 

## 2022-09-24 NOTE — Telephone Encounter (Signed)
[facility] on [address] is where she wants the scripts sent too and she was told that her Iron-FA-B Cmp-C-Biot-Probiotic (FUSION PLUS) CAPS  isnt covered and she is having a time with [facility] so she wants this and the other script that we sent in for her sent over there

## 2022-10-09 ENCOUNTER — Ambulatory Visit (INDEPENDENT_AMBULATORY_CARE_PROVIDER_SITE_OTHER): Payer: Medicaid Other | Admitting: Obstetrics

## 2022-10-09 ENCOUNTER — Other Ambulatory Visit: Payer: Medicaid Other

## 2022-10-09 VITALS — BP 113/80 | HR 111 | Wt 232.0 lb

## 2022-10-09 DIAGNOSIS — Z3483 Encounter for supervision of other normal pregnancy, third trimester: Secondary | ICD-10-CM

## 2022-10-09 DIAGNOSIS — Z3A30 30 weeks gestation of pregnancy: Secondary | ICD-10-CM

## 2022-10-09 LAB — POCT URINALYSIS DIPSTICK OB
Glucose, UA: NEGATIVE
POC,PROTEIN,UA: NEGATIVE

## 2022-10-09 MED ORDER — BLOOD GLUCOSE MONITOR KIT: PACK | 3 refills | Status: DC

## 2022-10-09 NOTE — Progress Notes (Signed)
No vb. No lof. Taking OTC iron supplement. Insurance would not cover the one [id number]

## 2022-10-09 NOTE — Progress Notes (Signed)
ROB at [redacted]w[redacted]d. Active baby. Vanessa Lindsey has had some BH ctx. Denies LOF and vaginal bleeding. Discussed 1-hour GTT results and recommendation for 3-hour test. She has difficulty arranging childcare and would prefer to check her blood sugars at home. She will check 4x/day until her next visit at keep a log. Explained how/when to test. Reviewed dietary modifications and encouraged regular exercise. Plan repeat growth scan at 32 weeks. RTC in 2 weeks.  Lloyd Huger, CNM

## 2022-10-15 ENCOUNTER — Encounter: Payer: Self-pay | Admitting: Certified Nurse Midwife

## 2022-10-25 ENCOUNTER — Other Ambulatory Visit: Payer: Medicaid Other

## 2022-10-25 ENCOUNTER — Encounter: Payer: Medicaid Other | Admitting: Advanced Practice Midwife

## 2022-10-31 ENCOUNTER — Encounter: Payer: Medicaid Other | Admitting: Certified Nurse Midwife

## 2022-10-31 ENCOUNTER — Other Ambulatory Visit: Payer: Medicaid Other

## 2022-11-06 ENCOUNTER — Other Ambulatory Visit: Payer: Self-pay | Admitting: Obstetrics

## 2022-11-06 ENCOUNTER — Ambulatory Visit (INDEPENDENT_AMBULATORY_CARE_PROVIDER_SITE_OTHER): Payer: Medicaid Other | Admitting: Certified Nurse Midwife

## 2022-11-06 ENCOUNTER — Ambulatory Visit (INDEPENDENT_AMBULATORY_CARE_PROVIDER_SITE_OTHER): Payer: Medicaid Other

## 2022-11-06 ENCOUNTER — Encounter: Payer: Self-pay | Admitting: Certified Nurse Midwife

## 2022-11-06 VITALS — BP 117/81 | HR 103 | Wt 238.1 lb

## 2022-11-06 DIAGNOSIS — Z3A31 31 weeks gestation of pregnancy: Secondary | ICD-10-CM

## 2022-11-06 DIAGNOSIS — O09293 Supervision of pregnancy with other poor reproductive or obstetric history, third trimester: Secondary | ICD-10-CM

## 2022-11-06 DIAGNOSIS — Z3483 Encounter for supervision of other normal pregnancy, third trimester: Secondary | ICD-10-CM

## 2022-11-06 DIAGNOSIS — Z3A34 34 weeks gestation of pregnancy: Secondary | ICD-10-CM

## 2022-11-06 LAB — POCT URINALYSIS DIPSTICK OB
Bilirubin, UA: NEGATIVE
Blood, UA: NEGATIVE
Glucose, UA: NEGATIVE
Ketones, UA: NEGATIVE
Nitrite, UA: NEGATIVE
Spec Grav, UA: 1.015 (ref 1.010–1.025)
Urobilinogen, UA: 0.2 E.U./dL
pH, UA: 6.5 (ref 5.0–8.0)

## 2022-11-06 MED ORDER — AMOXICILLIN 875 MG PO TABS: 875.0000 mg | ORAL_TABLET | Freq: Two times a day (BID) | ORAL | 0 refills | Status: AC

## 2022-11-06 NOTE — Progress Notes (Signed)
ROB doing well,  monitoring BS due to abnormal 1 hr, She was not able to completd 3 hr testing due to child care issue. BS log scanned to chart she has fastings 95-109. All 2 hr PP less than 120. Pt state she has a night time snack. Discussed having protien only at night . She will make this change and monitor BS x 1 wk then return for furhter evaluation.   She complains today of sinus infection, State she has congestion that she has been treating with over the counter medications for several days. Notes that it has not improve but has worsened now the discharge is green in color. Orders placed for antibiotics.   U/s today for growth see below , results reviewed BPP today 8/8.  Discussed recommendation for induction due to BMI. She declines state she wants to do it natural.   Follow up 1 wk for ROB/BS log review.   Patient Name: Vanessa Lindsey DOB: [birth year] MRN: [id number] LMP:  ULTRASOUND REPORT  Location: [facility] Date of Service: 11/06/2022   Indications:growth/afi (h/o IUGR) Findings:  Singleton intrauterine pregnancy is visualized with FHR at 144 BPM.   [name] gives an (U/S) Gestational age of [redacted]w[redacted]d and an (U/S) EDD of 01/02/2023; this correlates with the clinically established Estimated Date of Delivery: 12/17/22.   Fetal presentation is Cephalic.  Placenta: anterior.  AFI: 10.5 cm  Growth percentile is 13.5.   AC percentile is 6.5 BPD and HC percentile is <2.3 FL percentile is <2.3. EFW: 1856 grams (4lbs 1oz)   BPP Scoring: Movement: 2/2  Tone: 2/2  Breathing: 2/2  AFI: 2/2  Umbilical Artery Dopplers were performed due to fetal growth restriction  UA Dopplers appear normal. There is no absence or reversal of diastolic blood flow. 95th percentile is 3.58.  S/D ratio is 3.1 and normal for this gestational age.   Impression: 1. [redacted]w[redacted]d Viable Singleton Intrauterine pregnancy dated by previously established criteria. 2. AFI is 10.5 cm.  3.  BPP is 8/8 4. Growth is 13.5 %ile with AC at 6.5%. FL, BPD and HC are <2.3%    5. UA Dopplers are normal

## 2022-11-11 ENCOUNTER — Other Ambulatory Visit: Payer: Self-pay | Admitting: Obstetrics

## 2022-11-11 DIAGNOSIS — O365990 Maternal care for other known or suspected poor fetal growth, unspecified trimester, not applicable or unspecified: Secondary | ICD-10-CM

## 2022-11-12 ENCOUNTER — Telehealth: Payer: Self-pay

## 2022-11-12 NOTE — Progress Notes (Signed)
Technically overall growth needs to be below 10%ile for IUGR, however all of her measurements do seem to be below this number.  However with her being close to 36 weeks, it would be fine to starting weekly Dopplers moving forward. Repeat her growth scan in 3 weeks. I know she was against IOL for BMI but growth may take precedence and require IOL by 39 weeks.

## 2022-11-12 NOTE — Telephone Encounter (Signed)
Called patient to follow up from where she called after hour nurse line on 11/09/22. Called and left voicemail

## 2022-11-13 ENCOUNTER — Telehealth: Payer: Self-pay

## 2022-11-13 NOTE — Telephone Encounter (Signed)
Klaire returned my call today, states she's still not feeling better and still has green mucus and she asked if she can get another round of antibiotics.   Told her lets follow up and see what Missy thinks tomorrow at her visit with her. Patient understood and was good with that

## 2022-11-14 ENCOUNTER — Ambulatory Visit (INDEPENDENT_AMBULATORY_CARE_PROVIDER_SITE_OTHER): Payer: Medicaid Other | Admitting: Obstetrics

## 2022-11-14 ENCOUNTER — Encounter: Payer: Self-pay | Admitting: Obstetrics

## 2022-11-14 VITALS — BP 111/74 | HR 98 | Wt 235.0 lb

## 2022-11-14 DIAGNOSIS — Z3A35 35 weeks gestation of pregnancy: Secondary | ICD-10-CM

## 2022-11-14 DIAGNOSIS — O0993 Supervision of high risk pregnancy, unspecified, third trimester: Secondary | ICD-10-CM

## 2022-11-14 LAB — POCT URINALYSIS DIPSTICK OB
Appearance: NEGATIVE
Bilirubin, UA: NEGATIVE
Blood, UA: NEGATIVE
Glucose, UA: NEGATIVE
Ketones, UA: NEGATIVE
Leukocytes, UA: NEGATIVE
Nitrite, UA: NEGATIVE
POC,PROTEIN,UA: NEGATIVE
Spec Grav, UA: 1.015 (ref 1.010–1.025)
Urobilinogen, UA: 0.2 E.U./dL
pH, UA: 6.5 (ref 5.0–8.0)

## 2022-11-14 NOTE — Progress Notes (Addendum)
Vanessa Lindsey at [redacted]w[redacted]d. Active baby. Denies ctx, LOF, and vaginal bleeding. Vanessa Lindsey is still having some sinus symptoms after completing amoxicillin. Encouraged saline spray, OTC decongestant x 3 days, cool mist humidifier. Consider Augmentin or visit to PCP if no improvement. Blood sugar log for last 9 days: all values WNL. She reports she has stopped eating nighttime snack with carbs. Reviewed growth scan and antenatal testing. Repeat scheduled 11/29/22. Vanessa Lindsey prefers to avoid induction but would consider if recommended for fetal wellbeing. Prefers unmedicated labor ad birth. Discussed GBS and GC chlamydia at next visit. RTC in one week for Vanessa Lindsey and NST.  Vanessa Lindsey Spanish CNM

## 2022-11-19 ENCOUNTER — Other Ambulatory Visit (HOSPITAL_COMMUNITY)
Admission: RE | Admit: 2022-11-19 | Discharge: 2022-11-19 | Disposition: A | Discharge status: Home / Self Care | Payer: Medicaid Other | Source: Ambulatory Visit | Attending: Advanced Practice Midwife | Admitting: Advanced Practice Midwife

## 2022-11-19 ENCOUNTER — Ambulatory Visit: Payer: Medicaid Other

## 2022-11-19 ENCOUNTER — Ambulatory Visit (INDEPENDENT_AMBULATORY_CARE_PROVIDER_SITE_OTHER): Payer: Medicaid Other | Admitting: Advanced Practice Midwife

## 2022-11-19 ENCOUNTER — Encounter: Payer: Self-pay | Admitting: Advanced Practice Midwife

## 2022-11-19 VITALS — BP 111/76 | HR 88 | Wt 234.1 lb

## 2022-11-19 VITALS — BP 111/76 | HR 88 | Ht 61.0 in | Wt 234.1 lb

## 2022-11-19 DIAGNOSIS — O99210 Obesity complicating pregnancy, unspecified trimester: Secondary | ICD-10-CM | POA: Insufficient documentation

## 2022-11-19 DIAGNOSIS — Z3A36 36 weeks gestation of pregnancy: Secondary | ICD-10-CM

## 2022-11-19 DIAGNOSIS — O365930 Maternal care for other known or suspected poor fetal growth, third trimester, not applicable or unspecified: Secondary | ICD-10-CM

## 2022-11-19 DIAGNOSIS — O0993 Supervision of high risk pregnancy, unspecified, third trimester: Secondary | ICD-10-CM | POA: Diagnosis not present

## 2022-11-19 DIAGNOSIS — Z3685 Encounter for antenatal screening for Streptococcus B: Secondary | ICD-10-CM | POA: Diagnosis not present

## 2022-11-19 DIAGNOSIS — O99213 Obesity complicating pregnancy, third trimester: Secondary | ICD-10-CM | POA: Diagnosis not present

## 2022-11-19 DIAGNOSIS — O365990 Maternal care for other known or suspected poor fetal growth, unspecified trimester, not applicable or unspecified: Secondary | ICD-10-CM

## 2022-11-19 DIAGNOSIS — Z113 Encounter for screening for infections with a predominantly sexual mode of transmission: Secondary | ICD-10-CM | POA: Diagnosis not present

## 2022-11-19 DIAGNOSIS — E669 Obesity, unspecified: Secondary | ICD-10-CM

## 2022-11-19 DIAGNOSIS — Z369 Encounter for antenatal screening, unspecified: Secondary | ICD-10-CM

## 2022-11-19 LAB — POCT URINALYSIS DIPSTICK OB
Appearance: NORMAL
Bilirubin, UA: NEGATIVE
Blood, UA: NEGATIVE
Glucose, UA: NEGATIVE
Ketones, UA: NEGATIVE
Leukocytes, UA: NEGATIVE
Nitrite, UA: NEGATIVE
Odor: NORMAL
POC,PROTEIN,UA: NEGATIVE
Spec Grav, UA: 1.015 (ref 1.010–1.025)
Urobilinogen, UA: 0.2 E.U./dL
pH, UA: 6.5 (ref 5.0–8.0)

## 2022-11-19 NOTE — Progress Notes (Signed)
ROB [redacted]w[redacted]d Doing well. C/o Pelvic pressure. NST, GBS/Aptima today.

## 2022-11-19 NOTE — Progress Notes (Addendum)
Routine Prenatal Care Visit  Subjective  Vanessa Lindsey is a 26 y.o. G2P1001 at [redacted]w[redacted]d being seen today for ongoing prenatal care.  She is currently monitored for the following issues for this high-risk pregnancy and has Obesity (BMI 35.0-39.9 without comorbidity); Irregular periods/menstrual cycles; Smoker; History of prior pregnancy with intrauterine growth restricted newborn; History of premature rupture of membranes; ASCUS with positive high risk HPV cervical; Fetal growth restriction antepartum; Supervision of high risk pregnancy in third trimester; and Obesity affecting pregnancy on their problem list.  ----------------------------------------------------------------------------------- Patient reports no complaints.  She declines cervical exam today. Having occasional BH ctx.  Contractions: Not present. Vag. Bleeding: None.  Movement: Present. Leaking Fluid denies.  ----------------------------------------------------------------------------------- The following portions of the patient's history were reviewed and updated as appropriate: allergies, current medications, past family history, past medical history, past social history, past surgical history and problem list. Problem list updated.  Objective  Blood pressure 111/76, pulse 88, weight 234 lb 1.6 oz (106.2 kg), last menstrual period 03/18/2022. Pregravid weight 205 lb (93 kg) Total Weight Gain 29 lb 1.6 oz (13.2 kg) Urinalysis: Urine Protein Negative  Urine Glucose Negative  Fetal Status: Fetal Heart Rate (bpm): 130 Fundal Height: 40 cm Movement: Present     NST: reactive 20 minute tracing; 130 bpm, moderate variability, +accelerations, -decelerations  General:  Alert, oriented and cooperative. Patient is in no acute distress.  Skin: Skin is warm and dry. No rash noted.   Cardiovascular: Normal heart rate noted  Respiratory: Normal respiratory effort, no problems with respiration noted  Abdomen: Soft, gravid, appropriate for  gestational age. Pain/Pressure: Present (Pelvic Pressure)     Pelvic:   GBS/aptima collected         Extremities: Normal range of motion.     Mental Status: Normal mood and affect. Normal behavior. Normal judgment and thought content.   Assessment   26 y.o. G2P1001 at [redacted]w[redacted]d by  12/17/2022, by Ultrasound presenting for routine prenatal visit  Plan   Nursing Staff Provider  Office Location [facility] Dating  By 9w u/s  Language  English Anatomy US    Flu Vaccine   Genetic Screen  NIPS:   TDaP vaccine    Hgb A1C or  GTT Early : Third trimester :   Covid    LAB RESULTS   Rhogam   Blood Type O/Positive/-- (05/25 1438)   Feeding Plan  Antibody Negative (05/25 1438)  Contraception  Rubella 1.16 (05/25 1438)  Circumcision  RPR Non Reactive (09/19 1020)   Pediatrician   HBsAg Negative (05/25 1438)   Support Person  HIV Non Reactive (05/25 1438)  Prenatal Classes  Varicella     GBS  (For PCN allergy, check sensitivities)   BTL Consent     VBAC Consent  Pap      Hgb Electro    Pelvis Tested  CF      SMA          Growth restriction: APT; dopplers, growth scan   Preterm labor symptoms and general obstetric precautions including but not limited to vaginal bleeding, contractions, leaking of fluid and fetal movement were reviewed in detail with the patient. Please refer to After Visit Summary for other counseling recommendations.   Return in about 1 week (around 11/26/2022) for rob with NST/cord dopplers (has u/s scheduled on 11/30) .  Tresea Mall, CNM 11/19/2022 2:24 PM

## 2022-11-19 NOTE — Progress Notes (Signed)
    NURSE VISIT NST NOTE  Subjective:    Patient ID: Vanessa Lindsey, female    DOB: 24-Aug-1996, 26 y.o.   MRN: [id number]  HPI  Patient is a 26 y.o. G53P1001 female who presents for fetal monitoring per order from Guadlupe Spanish, CNM.   The following portions of the patient's history were reviewed and updated as appropriate: allergies, current medications, past family history, past medical history, past social history, past surgical history, and problem list.  Review of Systems Pertinent items are noted in HPI.   Objective:   Blood pressure 111/76, pulse 88, height 5\' 1"  (1.549 m), weight 234 lb 1.6 oz (106.2 kg), last menstrual period 03/18/2022, not currently breastfeeding. Body mass index is 44.23 kg/m. Estimated Date of Delivery: 12/17/22 General appearance: alert, cooperative, and no distress    Assessment:   1. Supervision of high risk pregnancy in third trimester   2. Fetal growth restriction antepartum   3. [redacted] weeks gestation of pregnancy      Plan:   Results reviewed and discussed with patient by  12/19/22, CNM.     Tresea Mall, LPN
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
## 2022-11-21 LAB — STREP GP B NAA: Strep Gp B NAA: NEGATIVE

## 2022-11-21 LAB — CERVICOVAGINAL ANCILLARY ONLY
Chlamydia: NEGATIVE
Comment: NEGATIVE
Comment: NEGATIVE
Comment: NORMAL
Neisseria Gonorrhea: NEGATIVE
Trichomonas: NEGATIVE
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
## 2022-11-28 ENCOUNTER — Encounter: Payer: Self-pay | Admitting: Advanced Practice Midwife

## 2022-11-29 ENCOUNTER — Ambulatory Visit (INDEPENDENT_AMBULATORY_CARE_PROVIDER_SITE_OTHER): Payer: Medicaid Other

## 2022-11-29 ENCOUNTER — Ambulatory Visit (INDEPENDENT_AMBULATORY_CARE_PROVIDER_SITE_OTHER): Payer: Medicaid Other | Admitting: Obstetrics and Gynecology

## 2022-11-29 ENCOUNTER — Other Ambulatory Visit: Payer: Self-pay | Admitting: Obstetrics

## 2022-11-29 VITALS — BP 108/55 | HR 104 | Wt 235.8 lb

## 2022-11-29 DIAGNOSIS — Z3A37 37 weeks gestation of pregnancy: Secondary | ICD-10-CM | POA: Diagnosis not present

## 2022-11-29 DIAGNOSIS — O99213 Obesity complicating pregnancy, third trimester: Secondary | ICD-10-CM

## 2022-11-29 DIAGNOSIS — O0993 Supervision of high risk pregnancy, unspecified, third trimester: Secondary | ICD-10-CM

## 2022-11-29 DIAGNOSIS — O365990 Maternal care for other known or suspected poor fetal growth, unspecified trimester, not applicable or unspecified: Secondary | ICD-10-CM

## 2022-11-29 DIAGNOSIS — E669 Obesity, unspecified: Secondary | ICD-10-CM

## 2022-11-29 DIAGNOSIS — Z369 Encounter for antenatal screening, unspecified: Secondary | ICD-10-CM

## 2022-11-29 DIAGNOSIS — Z8759 Personal history of other complications of pregnancy, childbirth and the puerperium: Secondary | ICD-10-CM

## 2022-11-29 DIAGNOSIS — O365930 Maternal care for other known or suspected poor fetal growth, third trimester, not applicable or unspecified: Secondary | ICD-10-CM

## 2022-11-29 LAB — POCT URINALYSIS DIPSTICK OB
Bilirubin, UA: NEGATIVE
Blood, UA: NEGATIVE
Glucose, UA: NEGATIVE
Ketones, UA: NEGATIVE
Leukocytes, UA: NEGATIVE
Nitrite, UA: NEGATIVE
Spec Grav, UA: 1.025 (ref 1.010–1.025)
Urobilinogen, UA: 0.2 E.U./dL
pH, UA: 6 (ref 5.0–8.0)

## 2022-11-29 NOTE — Progress Notes (Signed)
ROB: Patient noting some pelvic pressure but otherwise doing ok.  Reviewed recent US for growth due to concerns for SGA infant (borderline IUGR), growth at 11%ile, down from 13.5%, with normal Dopplers.  Patient has previously declined IOL for BMI, also notes that her last baby was small so is not concerned with overall growth today. Advised on weekly NSTs for monitoring for remainder of pregnancy, next growth scan with Dopplers would be due at 40 weeks if still pregnant. Also discussed natural cervical ripening agents and natural methods for inducing labor near due date if desired. GBS neg. RTC in 1 week.

## 2022-12-03 ENCOUNTER — Ambulatory Visit (INDEPENDENT_AMBULATORY_CARE_PROVIDER_SITE_OTHER): Payer: Medicaid Other

## 2022-12-03 ENCOUNTER — Ambulatory Visit (INDEPENDENT_AMBULATORY_CARE_PROVIDER_SITE_OTHER): Payer: Medicaid Other | Admitting: Obstetrics & Gynecology

## 2022-12-03 VITALS — BP 96/60 | HR 62 | Wt 241.6 lb

## 2022-12-03 VITALS — BP 90/60 | HR 62 | Ht 61.0 in | Wt 241.6 lb

## 2022-12-03 DIAGNOSIS — Z8759 Personal history of other complications of pregnancy, childbirth and the puerperium: Secondary | ICD-10-CM

## 2022-12-03 DIAGNOSIS — Z3A38 38 weeks gestation of pregnancy: Secondary | ICD-10-CM

## 2022-12-03 DIAGNOSIS — O365930 Maternal care for other known or suspected poor fetal growth, third trimester, not applicable or unspecified: Secondary | ICD-10-CM | POA: Diagnosis not present

## 2022-12-03 DIAGNOSIS — O99213 Obesity complicating pregnancy, third trimester: Secondary | ICD-10-CM

## 2022-12-03 DIAGNOSIS — R7309 Other abnormal glucose: Secondary | ICD-10-CM | POA: Diagnosis not present

## 2022-12-03 DIAGNOSIS — O0993 Supervision of high risk pregnancy, unspecified, third trimester: Secondary | ICD-10-CM

## 2022-12-03 DIAGNOSIS — E669 Obesity, unspecified: Secondary | ICD-10-CM

## 2022-12-03 DIAGNOSIS — O288 Other abnormal findings on antenatal screening of mother: Secondary | ICD-10-CM

## 2022-12-03 DIAGNOSIS — O365990 Maternal care for other known or suspected poor fetal growth, unspecified trimester, not applicable or unspecified: Secondary | ICD-10-CM

## 2022-12-03 LAB — POCT URINALYSIS DIPSTICK OB
Appearance: NORMAL
Bilirubin, UA: NEGATIVE
Blood, UA: NEGATIVE
Glucose, UA: NEGATIVE
Ketones, UA: NEGATIVE
Leukocytes, UA: NEGATIVE
Nitrite, UA: NEGATIVE
Odor: NORMAL
POC,PROTEIN,UA: NEGATIVE
Spec Grav, UA: 1.02 (ref 1.010–1.025)
Urobilinogen, UA: 0.2 E.U./dL
pH, UA: 5 (ref 5.0–8.0)

## 2022-12-03 NOTE — Progress Notes (Addendum)
    NURSE VISIT NOTE  Subjective:    Patient ID: Vanessa Lindsey, female    DOB: 08-May-1996, 26 y.o.   MRN: [id number]  HPI  Patient is a 26 y.o. G75P1001 female who presents for fetal monitoring per order from Hildred Laser, MD.   Objective:    BP 90/60   Pulse 62   Ht 5\' 1"  (1.549 m)   Wt 241 lb 9.6 oz (109.6 kg)   LMP 03/18/2022   BMI 45.65 kg/m  Estimated Date of Delivery: 12/17/22  Assessment:   1. Supervision of high risk pregnancy in third trimester   2. Obesity affecting pregnancy in third trimester, unspecified obesity type   3. History of prior pregnancy with intrauterine growth restricted newborn   4. [redacted] weeks gestation of pregnancy      Plan:   Results reviewed with 12/19/22, CNM. Non Reactive NST. Patient seen for BPP.     Carie Caddy, LPN

## 2022-12-03 NOTE — Addendum Note (Signed)
Addended by: Kathlene Cote on: 12/03/2022 11:55 AM   Modules accepted: Orders

## 2022-12-03 NOTE — Progress Notes (Signed)
   PRENATAL VISIT NOTE  Subjective:  Vanessa Lindsey is a 26 y.o. G2P1001 at [redacted]w[redacted]d being seen today for ongoing prenatal care.  She is currently monitored for the following issues for this high-risk pregnancy and has Obesity (BMI 35.0-39.9 without comorbidity); Irregular periods/menstrual cycles; Smoker; History of prior pregnancy with intrauterine growth restricted newborn; History of premature rupture of membranes; ASCUS with positive high risk HPV cervical; Fetal growth restriction antepartum; Supervision of high risk pregnancy in third trimester; Obesity affecting pregnancy; and [redacted] weeks gestation of pregnancy on their problem list.  Patient reports no complaints.  Contractions: Not present. Vag. Bleeding: None.  Movement: Present (Simultaneous filing. User may not have seen previous data.). Denies leaking of fluid.   The following portions of the patient's history were reviewed and updated as appropriate: allergies, current medications, past family history, past medical history, past social history, past surgical history and problem list.   Objective:   Vitals:   12/03/22 1315  BP: 96/60  Pulse: 62  Weight: 241 lb 9.6 oz (109.6 kg)    Fetal Status:     Movement: Present (Simultaneous filing. User may not have seen previous data.)     General:  Alert, oriented and cooperative. Patient is in no acute distress.  Skin: Skin is warm and dry. No rash noted.   Cardiovascular: Normal heart rate noted  Respiratory: Normal respiratory effort, no problems with respiration noted  Abdomen: Soft, gravid, appropriate for gestational age.  Pain/Pressure: Present     Pelvic:       FH 41 cm CVX 1 1/2/thick/ballottable/firm/mid position  Extremities: Normal range of motion.     Mental Status: Normal mood and affect. Normal behavior. Normal judgment and thought content.   Assessment and Plan:  Pregnancy: G2P1001 at [redacted]w[redacted]d 1. Supervision of high risk pregnancy in third trimester  - POC Urinalysis  Dipstick OB  2. Obesity affecting pregnancy in third trimester, unspecified obesity type  - POC Urinalysis Dipstick OB - Hemoglobin A1c  3. [redacted] weeks gestation of pregnancy  - POC Urinalysis Dipstick OB  4. Obesity (BMI 35.0-39.9 without comorbidity)   5. History of prior pregnancy with intrauterine growth restricted newborn   6. Fetal growth restriction antepartum - I have encouraged ways to ripen cervix at home. She declines IOL.  7. Abnormal glucose tolerance test  - Hemoglobin A1c - She did not get a 3 hour GTT  Term labor symptoms and general obstetric precautions including but not limited to vaginal bleeding, contractions, leaking of fluid and fetal movement were reviewed in detail with the patient. Please refer to After Visit Summary for other counseling recommendations.   Come back weekly for ROB and testing.  No future appointments.  Allie Bossier, MD

## 2022-12-04 LAB — HEMOGLOBIN A1C
Est. average glucose Bld gHb Est-mCnc: 126 mg/dL
Hgb A1c MFr Bld: 6 % — ABNORMAL HIGH (ref 4.8–5.6)

## 2022-12-06 ENCOUNTER — Encounter: Payer: Self-pay | Admitting: Obstetrics & Gynecology

## 2022-12-07 DIAGNOSIS — Z3A39 39 weeks gestation of pregnancy: Secondary | ICD-10-CM | POA: Insufficient documentation

## 2022-12-07 NOTE — Patient Instructions (Signed)

## 2022-12-07 NOTE — Progress Notes (Signed)
    NURSE VISIT NOTE  Subjective:    Patient ID: Vanessa Lindsey, female    DOB: 1996-06-25, 26 y.o.   MRN: [id number]  HPI  Patient is a 26 y.o. G65P1001 female who presents for fetal monitoring per order from Nicholaus Bloom, MD.   Objective:    BP 109/73   Pulse 72   Ht 5\' 1"  (1.549 m)   Wt 240 lb 14.4 oz (109.3 kg)   LMP 03/18/2022   BMI 45.52 kg/m  Estimated Date of Delivery: 12/17/22  Assessment:   1. Supervision of high risk pregnancy in third trimester   2. Obesity affecting pregnancy in third trimester, unspecified obesity type   3. Fetal growth restriction antepartum   4. [redacted] weeks gestation of pregnancy      Plan:   Results reviewed and discussed with patient by  12/19/22, CNM.     Carie Caddy, LPN

## 2022-12-10 ENCOUNTER — Encounter: Payer: Medicaid Other | Admitting: Obstetrics & Gynecology

## 2022-12-14 ENCOUNTER — Ambulatory Visit (INDEPENDENT_AMBULATORY_CARE_PROVIDER_SITE_OTHER): Payer: Medicaid Other | Admitting: Licensed Practical Nurse

## 2022-12-14 ENCOUNTER — Encounter: Payer: Self-pay | Admitting: Licensed Practical Nurse

## 2022-12-14 ENCOUNTER — Ambulatory Visit (INDEPENDENT_AMBULATORY_CARE_PROVIDER_SITE_OTHER): Payer: Medicaid Other

## 2022-12-14 VITALS — BP 109/73 | HR 72 | Wt 240.9 lb

## 2022-12-14 VITALS — BP 109/73 | HR 72 | Ht 61.0 in | Wt 240.9 lb

## 2022-12-14 DIAGNOSIS — O09293 Supervision of pregnancy with other poor reproductive or obstetric history, third trimester: Secondary | ICD-10-CM | POA: Diagnosis not present

## 2022-12-14 DIAGNOSIS — O0993 Supervision of high risk pregnancy, unspecified, third trimester: Secondary | ICD-10-CM

## 2022-12-14 DIAGNOSIS — E669 Obesity, unspecified: Secondary | ICD-10-CM | POA: Diagnosis not present

## 2022-12-14 DIAGNOSIS — O365990 Maternal care for other known or suspected poor fetal growth, unspecified trimester, not applicable or unspecified: Secondary | ICD-10-CM

## 2022-12-14 DIAGNOSIS — O99213 Obesity complicating pregnancy, third trimester: Secondary | ICD-10-CM

## 2022-12-14 DIAGNOSIS — Z3A39 39 weeks gestation of pregnancy: Secondary | ICD-10-CM

## 2022-12-14 LAB — POCT URINALYSIS DIPSTICK OB
Appearance: NORMAL
Bilirubin, UA: NEGATIVE
Blood, UA: NEGATIVE
Glucose, UA: NEGATIVE
Ketones, UA: NEGATIVE
Leukocytes, UA: NEGATIVE
Nitrite, UA: NEGATIVE
Odor: NORMAL
POC,PROTEIN,UA: NEGATIVE
Spec Grav, UA: 1.015 (ref 1.010–1.025)
Urobilinogen, UA: 0.2 E.U./dL
pH, UA: 6 (ref 5.0–8.0)

## 2022-12-14 NOTE — Progress Notes (Signed)
Routine Prenatal Care Visit  Subjective  Vanessa Lindsey is a 26 y.o. G2P1001 at [redacted]w[redacted]d being seen today for ongoing prenatal care.  She is currently monitored for the following issues for this {Blank single:19197::"high-risk","low-risk"} pregnancy and has Obesity (BMI 35.0-39.9 without comorbidity); Irregular periods/menstrual cycles; Smoker; History of prior pregnancy with intrauterine growth restricted newborn; History of premature rupture of membranes; ASCUS with positive high risk HPV cervical; Fetal growth restriction antepartum; Supervision of high risk pregnancy in third trimester; Obesity affecting pregnancy; [redacted] weeks gestation of pregnancy; and [redacted] weeks gestation of pregnancy on their problem list.  ----------------------------------------------------------------------------------- Patient reports {sx:14538}.   Contractions: Irritability.  .  Movement: Present. Leaking Fluid {Actions; denies/reports/admits to:19208}.  ----------------------------------------------------------------------------------- The following portions of the patient's history were reviewed and updated as appropriate: allergies, current medications, past family history, past medical history, past social history, past surgical history and problem list. Problem list updated.  Objective  Blood pressure 109/73, pulse 72, weight 240 lb 14.4 oz (109.3 kg), last menstrual period 03/18/2022. Pregravid weight 205 lb (93 kg) Total Weight Gain 35 lb 14.4 oz (16.3 kg) Urinalysis: Urine Protein Negative  Urine Glucose Negative  Fetal Status: Fetal Heart Rate (bpm): 135 Fundal Height: 43 cm Movement: Present     General:  Alert, oriented and cooperative. Patient is in no acute distress.  Skin: Skin is warm and dry. No rash noted.   Cardiovascular: Normal heart rate noted  Respiratory: Normal respiratory effort, no problems with respiration noted  Abdomen: Soft, gravid, appropriate for gestational age. Pain/Pressure: Present      Pelvic:  {Blank single:19197::"Cervical exam performed","Cervical exam deferred"}        Extremities: Normal range of motion.  Edema: Trace  Mental Status: Normal mood and affect. Normal behavior. Normal judgment and thought content.   Assessment   26 y.o. G2P1001 at [redacted]w[redacted]d by  12/17/2022, by Ultrasound presenting for {Blank single:19197::"routine","work-in"} prenatal visit  Plan   g2 Problems (from 05/03/22 to present)     Problem Noted Resolved   Supervision of high risk pregnancy in third trimester 11/19/2022 by Rocco Serene, LPN No   Overview Addendum 12/14/2022  9:43 AM by Rocco Serene, LPN     Nursing Staff Provider  Office Location [facility] Dating  By 9w u/s  Language  English Anatomy US    Flu Vaccine  Declined Genetic Screen  NIPS:   TDaP vaccine   09/18/22 Hgb A1C or  GTT Early : Third trimester :   Covid    LAB RESULTS   Rhogam  N/A Blood Type O/Positive/-- (05/25 1438)   Feeding Plan breast Antibody Negative (05/25 1438)  Contraception IUD Rubella 1.16 (05/25 1438)  Circumcision N/A RPR Non Reactive (09/19 1020)   Pediatrician  [facility] HBsAg Negative (05/25 1438)   Support Person Connie/Jose HIV Non Reactive (05/25 1438)  Prenatal Classes  Varicella     GBS  (For PCN allergy, check sensitivities)   BTL Consent     VBAC Consent  Pap      Hgb Electro    Pelvis Tested  CF      SMA          Growth restriction: APT; dopplers, growth scans, NST           {Blank single:19197::"Term","Preterm"} labor symptoms and general obstetric precautions including but not limited to vaginal bleeding, contractions, leaking of fluid and fetal movement were reviewed in detail with the patient. Please refer to After Visit Summary for other counseling recommendations.  Return in about 1 week (around 12/21/2022) for ROB, NST .  @MYSIGNATURE @

## 2022-12-17 ENCOUNTER — Other Ambulatory Visit: Payer: Medicaid Other

## 2022-12-17 ENCOUNTER — Telehealth: Payer: Self-pay | Admitting: Obstetrics and Gynecology

## 2022-12-17 ENCOUNTER — Ambulatory Visit (INDEPENDENT_AMBULATORY_CARE_PROVIDER_SITE_OTHER): Payer: Medicaid Other | Admitting: Obstetrics & Gynecology

## 2022-12-17 ENCOUNTER — Other Ambulatory Visit: Payer: Self-pay | Admitting: Licensed Practical Nurse

## 2022-12-17 ENCOUNTER — Ambulatory Visit: Payer: Medicaid Other

## 2022-12-17 DIAGNOSIS — O26893 Other specified pregnancy related conditions, third trimester: Secondary | ICD-10-CM

## 2022-12-17 DIAGNOSIS — N898 Other specified noninflammatory disorders of vagina: Secondary | ICD-10-CM

## 2022-12-17 DIAGNOSIS — O0990 Supervision of high risk pregnancy, unspecified, unspecified trimester: Secondary | ICD-10-CM

## 2022-12-17 DIAGNOSIS — O0993 Supervision of high risk pregnancy, unspecified, third trimester: Secondary | ICD-10-CM

## 2022-12-17 DIAGNOSIS — Z3A40 40 weeks gestation of pregnancy: Secondary | ICD-10-CM

## 2022-12-17 NOTE — Progress Notes (Signed)
  Pt 4owks, declines IOL. Already scheduled for BPP for postdates testing, Per Dr Valentino Saxon see note 11/29/22-pt needs growth scan and dopplers at 40 wks for SGA. Orders placed Carie Caddy, Ina Homes  [facility]  12/17/22  2:35 PM

## 2022-12-17 NOTE — Telephone Encounter (Signed)
I contacted patient leaving message for patient to call back to confirm scheduled appointment for 12/21 at 10:15 for nst and 10:45 with Dr Valentino Saxon.

## 2022-12-17 NOTE — Progress Notes (Signed)
   PRENATAL VISIT NOTE  Subjective:  Vanessa Lindsey is a 26 y.o. G2P1001 at [redacted]w[redacted]d being seen today as a work in visit after telling the ultrasound tech that she was having some liquid vaginal discharge since her last cervical exam. She denies any gush of fluid. Her AFI is normal today.   She is currently monitored for the following issues for this high-risk pregnancy and has Obesity (BMI 35.0-39.9 without comorbidity); Irregular periods/menstrual cycles; Smoker; History of prior pregnancy with intrauterine growth restricted newborn; History of premature rupture of membranes; ASCUS with positive high risk HPV cervical; Fetal growth restriction antepartum; Supervision of high risk pregnancy in third trimester; Obesity affecting pregnancy; [redacted] weeks gestation of pregnancy; and [redacted] weeks gestation of pregnancy on their problem list.  Patient reports good FM, denies bleeding, contractions.  The following portions of the patient's history were reviewed and updated as appropriate: allergies, current medications, past family history, past medical history, past social history, past surgical history and problem list.   Objective:  There were no vitals filed for this visit.  Fetal Status:           General:  Alert, oriented and cooperative. Patient is in no acute distress.  Skin: Skin is warm and dry. No rash noted.   Cardiovascular: Normal heart rate noted  Respiratory: Normal respiratory effort, no problems with respiration noted  Abdomen: Soft, gravid, appropriate for gestational age.        Pelvic: Cervical exam performed in the presence of a chaperone        Extremities: Normal range of motion.     Mental Status: Normal mood and affect. Normal behavior. Normal judgment and thought content.  NST- reactive SSE- negative nitrazine, neg pool, neg Valsalva  CVX- 1 01/01/49%/ballottable/vertex/moderate consistence and mid position Intact bag of water palpable  Assessment and Plan:  Pregnancy:  G2P1001 at [redacted]w[redacted]d There are no diagnoses linked to this encounter. Term labor symptoms and general obstetric precautions including but not limited to vaginal bleeding, contractions, leaking of fluid and fetal movement were reviewed in detail with the patient. Please refer to After Visit Summary for other counseling recommendations.   No follow-ups on file.  Future Appointments  Date Time Provider Department Center  12/17/2022  4:00 PM [facility] [place] [facility] None    Allie Bossier, MD

## 2022-12-18 ENCOUNTER — Other Ambulatory Visit: Payer: Self-pay

## 2022-12-18 ENCOUNTER — Encounter: Payer: Self-pay | Admitting: Obstetrics and Gynecology

## 2022-12-18 ENCOUNTER — Inpatient Hospital Stay
Admission: EM | Admit: 2022-12-18 | Discharge: 2022-12-20 | DRG: 806 | Disposition: A | Discharge status: Home / Self Care | Payer: Medicaid Other | Attending: Certified Nurse Midwife | Admitting: Certified Nurse Midwife

## 2022-12-18 DIAGNOSIS — D62 Acute posthemorrhagic anemia: Secondary | ICD-10-CM | POA: Diagnosis not present

## 2022-12-18 DIAGNOSIS — E669 Obesity, unspecified: Secondary | ICD-10-CM | POA: Diagnosis not present

## 2022-12-18 DIAGNOSIS — Z3A40 40 weeks gestation of pregnancy: Secondary | ICD-10-CM

## 2022-12-18 DIAGNOSIS — O480 Post-term pregnancy: Principal | ICD-10-CM | POA: Diagnosis present

## 2022-12-18 DIAGNOSIS — F17210 Nicotine dependence, cigarettes, uncomplicated: Secondary | ICD-10-CM | POA: Diagnosis present

## 2022-12-18 DIAGNOSIS — O99214 Obesity complicating childbirth: Secondary | ICD-10-CM | POA: Diagnosis present

## 2022-12-18 DIAGNOSIS — O9903 Anemia complicating the puerperium: Secondary | ICD-10-CM | POA: Diagnosis not present

## 2022-12-18 DIAGNOSIS — O09293 Supervision of pregnancy with other poor reproductive or obstetric history, third trimester: Secondary | ICD-10-CM | POA: Diagnosis not present

## 2022-12-18 DIAGNOSIS — O99334 Smoking (tobacco) complicating childbirth: Secondary | ICD-10-CM | POA: Diagnosis present

## 2022-12-18 DIAGNOSIS — O9081 Anemia of the puerperium: Secondary | ICD-10-CM | POA: Diagnosis not present

## 2022-12-18 DIAGNOSIS — O4202 Full-term premature rupture of membranes, onset of labor within 24 hours of rupture: Secondary | ICD-10-CM | POA: Diagnosis not present

## 2022-12-18 DIAGNOSIS — O4290 Premature rupture of membranes, unspecified as to length of time between rupture and onset of labor, unspecified weeks of gestation: Secondary | ICD-10-CM | POA: Diagnosis present

## 2022-12-18 DIAGNOSIS — F17209 Nicotine dependence, unspecified, with unspecified nicotine-induced disorders: Secondary | ICD-10-CM | POA: Diagnosis not present

## 2022-12-18 DIAGNOSIS — O0993 Supervision of high risk pregnancy, unspecified, third trimester: Principal | ICD-10-CM

## 2022-12-18 LAB — CBC
HCT: 32.7 % — ABNORMAL LOW (ref 36.0–46.0)
Hemoglobin: 10 g/dL — ABNORMAL LOW (ref 12.0–15.0)
MCH: 23.2 pg — ABNORMAL LOW (ref 26.0–34.0)
MCHC: 30.6 g/dL (ref 30.0–36.0)
MCV: 75.9 fL — ABNORMAL LOW (ref 80.0–100.0)
Platelets: 366 10*3/uL (ref 150–400)
RBC: 4.31 MIL/uL (ref 3.87–5.11)
RDW: 16.2 % — ABNORMAL HIGH (ref 11.5–15.5)
WBC: 14.4 10*3/uL — ABNORMAL HIGH (ref 4.0–10.5)
nRBC: 0 % (ref 0.0–0.2)

## 2022-12-18 LAB — TYPE AND SCREEN
ABO/RH(D): O POS
Antibody Screen: NEGATIVE

## 2022-12-18 MED ORDER — MISOPROSTOL 200 MCG PO TABS
ORAL_TABLET | ORAL | Status: AC
  Filled 2022-12-18: qty 4

## 2022-12-18 MED ORDER — MISOPROSTOL 50MCG HALF TABLET
50.0000 ug | ORAL_TABLET | Freq: Once | ORAL | Status: AC
  Administered 2022-12-18: 50 ug via ORAL

## 2022-12-18 MED ORDER — AMMONIA AROMATIC IN INHA
RESPIRATORY_TRACT | Status: AC
  Filled 2022-12-18: qty 10

## 2022-12-18 MED ORDER — OXYTOCIN-SODIUM CHLORIDE 30-0.9 UT/500ML-% IV SOLN
2.5000 [IU]/h | INTRAVENOUS | Status: DC
  Administered 2022-12-19: 2.5 [IU]/h via INTRAVENOUS
  Filled 2022-12-18: qty 500

## 2022-12-18 MED ORDER — LIDOCAINE HCL (PF) 1 % IJ SOLN
INTRAMUSCULAR | Status: AC
  Filled 2022-12-18: qty 30

## 2022-12-18 MED ORDER — LIDOCAINE HCL (PF) 1 % IJ SOLN: 30.0000 mL | INTRAMUSCULAR | Status: DC | PRN

## 2022-12-18 MED ORDER — ONDANSETRON HCL 4 MG/2ML IJ SOLN
4.0000 mg | Freq: Four times a day (QID) | INTRAMUSCULAR | Status: DC | PRN
  Administered 2022-12-19: 4 mg via INTRAVENOUS
  Filled 2022-12-18: qty 2

## 2022-12-18 MED ORDER — LACTATED RINGERS IV SOLN: INTRAVENOUS | Status: DC

## 2022-12-18 MED ORDER — OXYTOCIN 10 UNIT/ML IJ SOLN
INTRAMUSCULAR | Status: AC
  Filled 2022-12-18: qty 2

## 2022-12-18 MED ORDER — FENTANYL CITRATE (PF) 100 MCG/2ML IJ SOLN
50.0000 ug | INTRAMUSCULAR | Status: DC | PRN
  Administered 2022-12-19 (×3): 100 ug via INTRAVENOUS
  Filled 2022-12-18 (×3): qty 2

## 2022-12-18 MED ORDER — LACTATED RINGERS IV SOLN: 500.0000 mL | INTRAVENOUS | Status: DC | PRN

## 2022-12-18 MED ORDER — ACETAMINOPHEN 325 MG PO TABS: 650.0000 mg | ORAL_TABLET | ORAL | Status: DC | PRN

## 2022-12-18 MED ORDER — MISOPROSTOL 50MCG HALF TABLET
ORAL_TABLET | ORAL | Status: AC
  Filled 2022-12-18: qty 1

## 2022-12-18 MED ORDER — OXYTOCIN BOLUS FROM INFUSION
333.0000 mL | Freq: Once | INTRAVENOUS | Status: AC
  Administered 2022-12-19: 333 mL via INTRAVENOUS

## 2022-12-18 NOTE — OB Triage Note (Signed)
Vanessa Lindsey 26 y.o. presents to Labor & Delivery triage via wheelchair steered by ED staff reporting LOF since 1700. She is a G2P1001 at [redacted]w[redacted]d . She denies signs and symptoms consistent with active vaginal bleeding. She endorses contractions q 10 and states positive fetal movement. She desires an unmedicated delivery. External FM and TOCO applied to non-tender abdomen. Vital signs obtained and within normal limits. Patient oriented to care environment including call bell and bed control use. Tresea Mall, CNM notified of patient's arrival.

## 2022-12-18 NOTE — Telephone Encounter (Signed)
I spoke with patient via phone. Patient is aware of scheduled appointments to 12/21

## 2022-12-18 NOTE — H&P (Signed)
OB History & Physical   History of Present Illness:  Chief Complaint: water broke  HPI:  Vanessa Lindsey is a 26 y.o. G2P1001 female at [redacted]w[redacted]d dated by 9 week ultrasound.  Her pregnancy has been complicated by obesity, ASCUS PAP with HR HPV, history of pregnancy with IUGR, fetal growth restriction this pregnancy/resolved.   She reports irregular contractions.   She reports leakage of fluid at 5 PM tonight.   She denies vaginal bleeding.   She reports fetal movement.    Total weight gain for pregnancy: 16.3 kg   Obstetrical Problem List: g2 Problems (from 05/03/22 to present)     Problem Noted Resolved   Supervision of high risk pregnancy in third trimester 11/19/2022 by Rocco Serene, LPN No   Overview Addendum 12/14/2022  9:43 AM by Rocco Serene, LPN     Nursing Staff Provider  Office Location [facility] Dating  By 9w u/s  Language  English Anatomy US    Flu Vaccine  Declined Genetic Screen  NIPS:   TDaP vaccine   09/18/22 Hgb A1C or  GTT Early : Third trimester :   Covid    LAB RESULTS   Rhogam  N/A Blood Type O/Positive/-- (05/25 1438)   Feeding Plan breast Antibody Negative (05/25 1438)  Contraception IUD Rubella 1.16 (05/25 1438)  Circumcision N/A RPR Non Reactive (09/19 1020)   Pediatrician  [facility] HBsAg Negative (05/25 1438)   Support Person Connie/Jose HIV Non Reactive (05/25 1438)  Prenatal Classes  Varicella     GBS  (For PCN allergy, check sensitivities)   BTL Consent     VBAC Consent  Pap      Hgb Electro    Pelvis Tested  CF      SMA          Growth restriction- resolved on 12/18 scan: APT; dopplers, growth scans, NST           Maternal Medical History:   Past Medical History:  Diagnosis Date   Allergy    Asthma    Chickenpox     History reviewed. No pertinent surgical history.  Allergies  Allergen Reactions   Sulfa Antibiotics Hives    Prior to Admission medications   Medication Sig Start Date End Date  Taking? Authorizing Provider  albuterol (VENTOLIN HFA) 108 (90 Base) MCG/ACT inhaler Inhale 2 puffs into the lungs every 6 (six) hours as needed. 09/09/19  Yes Shambley, Melody N, CNM  aspirin EC 81 MG tablet Take 1 tablet (81 mg total) by mouth daily. Take after 12 weeks for prevention of preeclampssia later in pregnancy 07/26/22  Yes Hildred Laser, MD  Iron-FA-B Cmp-C-Biot-Probiotic (FUSION PLUS) CAPS Take by mouth.   Yes [provider]  Prenatal Vit-Fe Phos-FA-Omega (VITAFOL GUMMIES) 3.33-0.333-34.8 MG CHEW Chew 3 each by mouth daily. 09/24/22  Yes Swanson, Efraim Kaufmann M, CNM  cyclobenzaprine (FLEXERIL) 10 MG tablet Take 1 tablet (10 mg total) by mouth every 8 (eight) hours as needed for muscle spasms. Patient not taking: Reported on 12/18/2022 07/26/22   Hildred Laser, MD    OB History  Gravida Para Term Preterm AB Living  2 1 1  0 0 1  SAB IAB Ectopic Multiple Live Births  0 0 0 0 1    # Outcome Date GA Lbr Len/2nd Weight Sex Delivery Anes PTL Lv  2 Current           1 Term 10/13/19 [redacted]w[redacted]d  2807 g F Vag-Spont None N LIV  Prenatal care site: [facility]  Social History: She  reports that she has been smoking cigarettes. She has been smoking an average of .25 packs per day. She has never used smokeless tobacco. She reports that she does not currently use alcohol. She reports that she does not use drugs.  Family History: family history includes Cancer in her maternal grandmother; Diabetes in her father; Heart attack in her father; Hyperlipidemia in her father; Hypothyroidism in her mother.    Review of Systems:  Review of Systems  Constitutional:  Negative for chills and fever.  HENT:  Negative for congestion, ear discharge, ear pain, hearing loss, sinus pain and sore throat.   Eyes:  Negative for blurred vision and double vision.  Respiratory:  Negative for cough, shortness of breath and wheezing.   Cardiovascular:  Negative for chest pain, palpitations and leg swelling.   Gastrointestinal:  Positive for abdominal pain. Negative for blood in stool, constipation, diarrhea, heartburn, melena, nausea and vomiting.  Genitourinary:  Negative for dysuria, flank pain, frequency, hematuria and urgency.  Musculoskeletal:  Negative for back pain, joint pain and myalgias.  Skin:  Negative for itching and rash.  Neurological:  Negative for dizziness, tingling, tremors, sensory change, speech change, focal weakness, seizures, loss of consciousness, weakness and headaches.  Endo/Heme/Allergies:  Negative for environmental allergies. Does not bruise/bleed easily.  Psychiatric/Behavioral:  Negative for depression, hallucinations, memory loss, substance abuse and suicidal ideas. The patient is not nervous/anxious and does not have insomnia.      Physical Exam:  BP 108/80 (BP Location: Left Arm)   Pulse 100   Temp 98.2 F (36.8 C) (Oral)   Resp 16   LMP 03/18/2022   Constitutional: Well nourished, well developed female in no acute distress.  HEENT: normal Skin: Warm and dry.  Cardiovascular: Regular rate and rhythm.   Extremity:  no edema   Respiratory: Clear to auscultation bilateral. Normal respiratory effort Abdomen: FHT present Psych: Alert and Oriented x3. No memory deficits. Normal mood and affect.  MS: normal gait, normal bilateral lower extremity ROM/strength/stability.  Pelvic exam: per RN J Guptill   Baseline FHR: 135 beats/min   Variability: moderate   Accelerations: present   Decelerations: absent Contractions: present frequency: every 5-8 Overall assessment: reassuring    Lab Results  Component Value Date   SARSCOV2NAA NEGATIVE 10/13/2019    Assessment:  Vanessa Lindsey is a 25 y.o. G28P1001 female at [redacted]w[redacted]d with SROM.   Plan:  Admit to Labor & Delivery  CBC, T&S, Clrs, IVF GBS negative.   Fetal well-being: Category 1 Labor: augment with cytotec 50 mcg PO if needed   Tresea Mall, CNM 12/18/2022 9:34 PM

## 2022-12-19 ENCOUNTER — Encounter: Payer: Self-pay | Admitting: Advanced Practice Midwife

## 2022-12-19 ENCOUNTER — Inpatient Hospital Stay: Payer: Medicaid Other | Admitting: Anesthesiology

## 2022-12-19 DIAGNOSIS — O480 Post-term pregnancy: Principal | ICD-10-CM

## 2022-12-19 DIAGNOSIS — D62 Acute posthemorrhagic anemia: Secondary | ICD-10-CM

## 2022-12-19 DIAGNOSIS — O9903 Anemia complicating the puerperium: Secondary | ICD-10-CM

## 2022-12-19 DIAGNOSIS — O4202 Full-term premature rupture of membranes, onset of labor within 24 hours of rupture: Secondary | ICD-10-CM

## 2022-12-19 DIAGNOSIS — E669 Obesity, unspecified: Secondary | ICD-10-CM

## 2022-12-19 DIAGNOSIS — O09293 Supervision of pregnancy with other poor reproductive or obstetric history, third trimester: Secondary | ICD-10-CM

## 2022-12-19 DIAGNOSIS — O99214 Obesity complicating childbirth: Secondary | ICD-10-CM

## 2022-12-19 DIAGNOSIS — Z3A40 40 weeks gestation of pregnancy: Secondary | ICD-10-CM

## 2022-12-19 LAB — RPR: RPR Ser Ql: NONREACTIVE

## 2022-12-19 MED ORDER — DIPHENHYDRAMINE HCL 50 MG/ML IJ SOLN: 12.5000 mg | INTRAMUSCULAR | Status: DC | PRN

## 2022-12-19 MED ORDER — LIDOCAINE HCL (PF) 1 % IJ SOLN
INTRAMUSCULAR | Status: DC | PRN
  Administered 2022-12-19: 1 mL via SUBCUTANEOUS

## 2022-12-19 MED ORDER — SENNOSIDES-DOCUSATE SODIUM 8.6-50 MG PO TABS
2.0000 | ORAL_TABLET | Freq: Every day | ORAL | Status: DC
  Filled 2022-12-19: qty 2

## 2022-12-19 MED ORDER — LIDOCAINE-EPINEPHRINE (PF) 1.5 %-1:200000 IJ SOLN
INTRAMUSCULAR | Status: DC | PRN
  Administered 2022-12-19: 3 mL via EPIDURAL

## 2022-12-19 MED ORDER — DIPHENHYDRAMINE HCL 25 MG PO CAPS: 25.0000 mg | ORAL_CAPSULE | Freq: Four times a day (QID) | ORAL | Status: DC | PRN

## 2022-12-19 MED ORDER — PHENYLEPHRINE 80 MCG/ML (10ML) SYRINGE FOR IV PUSH (FOR BLOOD PRESSURE SUPPORT): 80.0000 ug | PREFILLED_SYRINGE | INTRAVENOUS | Status: DC | PRN

## 2022-12-19 MED ORDER — EPHEDRINE 5 MG/ML INJ: 10.0000 mg | INTRAVENOUS | Status: DC | PRN

## 2022-12-19 MED ORDER — DIBUCAINE (PERIANAL) 1 % EX OINT
1.0000 | TOPICAL_OINTMENT | CUTANEOUS | Status: DC | PRN
  Administered 2022-12-19: 1 via RECTAL
  Filled 2022-12-19: qty 28

## 2022-12-19 MED ORDER — ACETAMINOPHEN 325 MG PO TABS
650.0000 mg | ORAL_TABLET | ORAL | Status: DC | PRN
  Administered 2022-12-19: 650 mg via ORAL
  Filled 2022-12-19: qty 2

## 2022-12-19 MED ORDER — COCONUT OIL OIL: 1.0000 | TOPICAL_OIL | Status: DC | PRN

## 2022-12-19 MED ORDER — ONDANSETRON HCL 4 MG PO TABS: 4.0000 mg | ORAL_TABLET | ORAL | Status: DC | PRN

## 2022-12-19 MED ORDER — TETANUS-DIPHTH-ACELL PERTUSSIS 5-2.5-18.5 LF-MCG/0.5 IM SUSY: 0.5000 mL | PREFILLED_SYRINGE | Freq: Once | INTRAMUSCULAR | Status: DC

## 2022-12-19 MED ORDER — PRENATAL MULTIVITAMIN CH
1.0000 | ORAL_TABLET | Freq: Every day | ORAL | Status: DC
  Administered 2022-12-19 – 2022-12-20 (×2): 1 via ORAL
  Filled 2022-12-19 (×2): qty 1

## 2022-12-19 MED ORDER — IBUPROFEN 600 MG PO TABS
600.0000 mg | ORAL_TABLET | Freq: Four times a day (QID) | ORAL | Status: DC
  Administered 2022-12-19 – 2022-12-20 (×5): 600 mg via ORAL
  Filled 2022-12-19 (×5): qty 1

## 2022-12-19 MED ORDER — BENZOCAINE-MENTHOL 20-0.5 % EX AERO
1.0000 | INHALATION_SPRAY | CUTANEOUS | Status: DC | PRN
  Administered 2022-12-19: 1 via TOPICAL
  Filled 2022-12-19: qty 56

## 2022-12-19 MED ORDER — FENTANYL-BUPIVACAINE-NACL 0.5-0.125-0.9 MG/250ML-% EP SOLN
12.0000 mL/h | EPIDURAL | Status: DC | PRN
  Administered 2022-12-19: 12 mL/h via EPIDURAL
  Filled 2022-12-19: qty 250

## 2022-12-19 MED ORDER — SODIUM CHLORIDE 0.9 % IV SOLN
INTRAVENOUS | Status: DC | PRN
  Administered 2022-12-19 (×2): 5 mL via EPIDURAL

## 2022-12-19 MED ORDER — WITCH HAZEL-GLYCERIN EX PADS
1.0000 | MEDICATED_PAD | CUTANEOUS | Status: DC | PRN
  Administered 2022-12-19: 1 via TOPICAL
  Filled 2022-12-19: qty 100

## 2022-12-19 MED ORDER — LACTATED RINGERS IV SOLN
500.0000 mL | Freq: Once | INTRAVENOUS | Status: AC
  Administered 2022-12-19: 500 mL via INTRAVENOUS

## 2022-12-19 MED ORDER — ONDANSETRON HCL 4 MG/2ML IJ SOLN: 4.0000 mg | INTRAMUSCULAR | Status: DC | PRN

## 2022-12-19 MED ORDER — SIMETHICONE 80 MG PO CHEW: 80.0000 mg | CHEWABLE_TABLET | ORAL | Status: DC | PRN

## 2022-12-19 NOTE — Progress Notes (Signed)
Vanessa Lindsey is stable after delivery. Her attentive support people are at the bedside. She is bonding with infant and performing skin to skin and breastfeeding. She has eaten and tolerated a regular diet. Epidural catheter removed by RN, tip intact, no bleeding noted at site. Pt is stable and initially utilized the sara steady to the bathroom, pt reports inability to void at this time, foley was removed at 0717. Pt then ambulated from the bathroom to the wheelchair and tolerated the activity well. Patient transferred to mother/baby unit RM [id number] with all belongings  for couplet care. Report given to [facility]

## 2022-12-19 NOTE — Anesthesia Preprocedure Evaluation (Signed)
Anesthesia Evaluation  Patient identified by MRN, date of birth, ID band Patient awake    Reviewed: Allergy & Precautions, NPO status , Patient's Chart, lab work & pertinent test results  Airway Mallampati: III  TM Distance: >3 FB Neck ROM: full    Dental  (+) Chipped   Pulmonary neg shortness of breath, asthma , Current Smoker   Pulmonary exam normal        Cardiovascular Exercise Tolerance: Good (-) hypertensionnegative cardio ROS Normal cardiovascular exam     Neuro/Psych    GI/Hepatic negative GI ROS,,,  Endo/Other    Morbid obesity  Renal/GU   negative genitourinary   Musculoskeletal   Abdominal   Peds  Hematology negative hematology ROS (+)   Anesthesia Other Findings Past Medical History: No date: Allergy No date: Asthma No date: Chickenpox  History reviewed. No pertinent surgical history.     Reproductive/Obstetrics (+) Pregnancy                             Anesthesia Physical Anesthesia Plan  ASA: 3  Anesthesia Plan: Epidural   Post-op Pain Management:    Induction:   PONV Risk Score and Plan:   Airway Management Planned: Natural Airway  Additional Equipment:   Intra-op Plan:   Post-operative Plan:   Informed Consent: I have reviewed the patients History and Physical, chart, labs and discussed the procedure including the risks, benefits and alternatives for the proposed anesthesia with the patient or authorized representative who has indicated his/her understanding and acceptance.     Dental Advisory Given  Plan Discussed with: Anesthesiologist  Anesthesia Plan Comments: (Patient reports no bleeding problems and no anticoagulant use.   Patient consented for risks of anesthesia including but not limited to:  - adverse reactions to medications - risk of bleeding, infection and or nerve damage from epidural that could lead to paralysis - risk of  headache or failed epidural - nerve damage due to positioning - that if epidural is used for C-section that there is a chance of epidural failure requiring spinal placement or conversion to GA - Damage to heart, brain, lungs, other parts of body or loss of life  Patient voiced understanding.)       Anesthesia Quick Evaluation

## 2022-12-19 NOTE — Anesthesia Procedure Notes (Signed)
Epidural Patient location during procedure: OB Start time: 12/19/2022 4:14 AM End time: 12/19/2022 4:19 AM  Staffing Anesthesiologist: Cianna Kasparian, Cleda Mccreedy, MD Performed: anesthesiologist   Preanesthetic Checklist Completed: patient identified, IV checked, site marked, risks and benefits discussed, surgical consent, monitors and equipment checked, pre-op evaluation and timeout performed  Epidural Patient position: sitting Prep: ChloraPrep Patient monitoring: heart rate, continuous pulse ox and blood pressure Approach: midline Location: L3-L4 Injection technique: LOR saline  Needle:  Needle type: Tuohy  Needle gauge: 17 G Needle length: 9 cm and 9 Needle insertion depth: 7 cm Catheter type: closed end flexible Catheter size: 19 Gauge Catheter at skin depth: 11 cm Test dose: negative and 1.5% lidocaine with Epi 1:200 K  Assessment Sensory level: T10 Events: blood not aspirated, no cerebrospinal fluid, injection not painful, no injection resistance, no paresthesia and negative IV test  Additional Notes 1 attempt Pt. Evaluated and documentation done after procedure finished. Patient identified. Risks/Benefits/Options discussed with patient including but not limited to bleeding, infection, nerve damage, paralysis, failed block, incomplete pain control, headache, blood pressure changes, nausea, vomiting, reactions to medication both or allergic, itching and postpartum back pain. Confirmed with bedside nurse the patient's most recent platelet count. Confirmed with patient that they are not currently taking any anticoagulation, have any bleeding history or any family history of bleeding disorders. Patient expressed understanding and wished to proceed. All questions were answered. Sterile technique was used throughout the entire procedure. Please see nursing notes for vital signs. Test dose was given through epidural catheter and negative prior to continuing to dose epidural or start  infusion. Warning signs of high block given to the patient including shortness of breath, tingling/numbness in hands, complete motor block, or any concerning symptoms with instructions to call for help. Patient was given instructions on fall risk and not to get out of bed. All questions and concerns addressed with instructions to call with any issues or inadequate analgesia.    Patient tolerated the insertion well without immediate complications.Reason for block:procedure for pain

## 2022-12-19 NOTE — Progress Notes (Signed)
Called to room for FHR deceleration. Resolved with position changes and fluid bolus. She declines FSE. Patient is comfortable with epidural.  Toco: every 2-4 minutes Fetal well being: 135/140, moderate variability, +accelerations, decelerations: variables, early, prolonged (likely some coincidence at the time as well)  Cervical exam: 8/80/0  Anticipate vaginal delivery  Tresea Mall, CNM

## 2022-12-19 NOTE — Discharge Summary (Cosign Needed Addendum)
OB Discharge Summary     Patient Name: Vanessa Lindsey DOB: May 20, 1996 MRN: [id number]  Date of admission: 12/18/2022 Delivering provider: Tresea Mall, CNM  Date of Delivery: 12/19/2022 Date of discharge: 12/20/2022  Admitting diagnosis: Amniotic fluid leaking [O42.90] Intrauterine pregnancy: [redacted]w[redacted]d     Secondary diagnosis: None     Discharge diagnosis: Term Pregnancy Delivered                                                                                                Post partum procedures: none  Augmentation: Cytotec  Complications: None  Hospital course:  Onset of Labor With Vaginal Delivery      26 y.o. yo G2P1001 at [redacted]w[redacted]d was admitted in Latent Labor on 12/18/2022. Labor course was complicated by NA  Membrane Rupture Time/Date: 3:00 PM ,12/18/2022   Delivery Method:Vaginal, Spontaneous  Episiotomy: None  Lacerations:  None  See delivery note for details  Patient had an uncomplicated postpartum course.  She is tolerating a regular diet, her pain is controlled with PO medication, she is ambulating and voiding without difficulty.   Patient is discharged home in stable condition on 12/20/22  Newborn Data: Birth date:12/19/2022  Birth time:7:18 AM  Gender:Female  Juliana Living status:Living  Apgars:9 ,9  Weight:2890 g   Physical exam  Vitals:   12/19/22 1834 12/19/22 1915 12/20/22 0000 12/20/22 0849  BP: 114/83 125/65 121/72 114/74  Pulse: 83 91 91 100  Resp: 16 17 18 20   Temp: (!) 97.5 F (36.4 C) 97.8 F (36.6 C) (!) 97.5 F (36.4 C) 98.4 F (36.9 C)  TempSrc: Oral Oral Oral Oral  SpO2: 99% 99% 99% 100%   General: alert, cooperative, and no distress Heart: RRR Lungs: CTAB Abdomen: soft, non-tender, normal bowel sounds Lochia: appropriate Uterine Fundus: firm Incision: N/A DVT Evaluation: No evidence of DVT seen on physical exam.  Labs: Lab Results  Component Value Date   WBC 14.9 (H) 12/20/2022   HGB 8.9 (L) 12/20/2022   HCT 29.3 (L)  12/20/2022   MCV 77.5 (L) 12/20/2022   PLT 343 12/20/2022    Discharge instruction: per After Visit Summary.  Medications:  Allergies as of 12/20/2022       Reactions   Sulfa Antibiotics Hives        Medication List     STOP taking these medications    aspirin EC 81 MG tablet   cyclobenzaprine 10 MG tablet Commonly known as: FLEXERIL       TAKE these medications    albuterol 108 (90 Base) MCG/ACT inhaler Commonly known as: VENTOLIN HFA Inhale 2 puffs into the lungs every 6 (six) hours as needed.   Fusion Plus Caps Take by mouth.   Vitafol Gummies 3.33-0.333-34.8 MG Chew Chew 3 each by mouth daily.          Diet: routine diet  Activity: Advance as tolerated. Pelvic rest for 6 weeks.   Outpatient follow up:  Follow-up Information     02-12-1980, CNM. Schedule an appointment as soon as possible for a visit.   Specialty: Obstetrics Why: 2 week telephone visit and  6 week in office postpartum visits and Paragard IUD insertion Needs colposcopy Contact information: [address] [place] [address] [phone]                   Postpartum contraception: IUD Paragard Rhogam Given postpartum: Rh positive Rubella vaccine given postpartum: immune Varicella vaccine given postpartum: immune TDaP given antepartum or postpartum: given antepartum    Newborn Delivery   Birth date/time: 12/19/2022 07:18:00 Delivery type: Vaginal, Spontaneous       Baby Feeding: Breast  Disposition:home with mother  SIGNED:  Glenetta Borg, CNM 12/20/2022 10:13 AM

## 2022-12-19 NOTE — Lactation Note (Signed)
This note was copied from a baby's chart. Lactation Consultation Note  Patient Name: Vanessa Lindsey [id number] Date: 12/19/2022 Reason for consult: Term;Initial assessment Age:28 hours  Maternal Data This is mom's second baby SVD. Mom has a history of smoking cigarettes (0.25 pack/ day) on some days. She has a history of breastfeeding for 9 months with her 23 year old daughter.   Today on initial consult, mom had questions concerning tongue tie, pumping, proper flange sizing.  Has patient been taught Hand Expression?: Yes (Mom is an experienced breastfeeding parent and has knowledge how to hand express.) Does the patient have breastfeeding experience prior to this delivery?: Yes How long did the patient breastfeed?: 9 months  Feeding Mother's Current Feeding Choice: Breast Milk Observed baby with good tongue extension. No evidence of tongue tie. Baby recently fed at 1:10pm and was not currently interested in feeding at this time. Mom knows how to call LC/ nurse for assistance.      Interventions Interventions: Breast feeding basics reviewed;Skin to skin;Hand express;Support pillows;Education Discussed with mom how to properly size her nipples for the correct flange size. Also discussed with mom the appropriate timeframe of when to start pumping. Mom is returning to work.  Discharge Discharge Education: Engorgement and breast care;Warning signs for feeding baby;Outpatient recommendation Pump: Personal;Hands Free;Manual  Consult Status Consult Status: Follow-up Date: 12/20/22 Follow-up type: In-patient  Provided update to care nurse  Susette Racer 12/19/2022, 2:47 PM

## 2022-12-20 ENCOUNTER — Encounter: Payer: Medicaid Other | Admitting: Obstetrics and Gynecology

## 2022-12-20 ENCOUNTER — Other Ambulatory Visit: Payer: Medicaid Other

## 2022-12-20 LAB — CBC
HCT: 29.3 % — ABNORMAL LOW (ref 36.0–46.0)
Hemoglobin: 8.9 g/dL — ABNORMAL LOW (ref 12.0–15.0)
MCH: 23.5 pg — ABNORMAL LOW (ref 26.0–34.0)
MCHC: 30.4 g/dL (ref 30.0–36.0)
MCV: 77.5 fL — ABNORMAL LOW (ref 80.0–100.0)
Platelets: 343 10*3/uL (ref 150–400)
RBC: 3.78 MIL/uL — ABNORMAL LOW (ref 3.87–5.11)
RDW: 16.5 % — ABNORMAL HIGH (ref 11.5–15.5)
WBC: 14.9 10*3/uL — ABNORMAL HIGH (ref 4.0–10.5)
nRBC: 0 % (ref 0.0–0.2)

## 2022-12-20 NOTE — Anesthesia Postprocedure Evaluation (Signed)
Anesthesia Post Note  Patient: Multimedia programmer  Procedure(s) Performed: AN AD HOC LABOR EPIDURAL  Patient location during evaluation: Mother Baby Anesthesia Type: Epidural Level of consciousness: awake and alert Pain management: pain level controlled Vital Signs Assessment: post-procedure vital signs reviewed and stable Respiratory status: spontaneous breathing, nonlabored ventilation and respiratory function stable Cardiovascular status: stable Postop Assessment: no headache, no backache and epidural receding Anesthetic complications: no   No notable events documented.   Last Vitals:  Vitals:   12/19/22 1915 12/20/22 0000  BP: 125/65 121/72  Pulse: 91 91  Resp: 17 18  Temp: 36.6 C (!) 36.4 C  SpO2: 99% 99%    Last Pain:  Vitals:   12/20/22 0000  TempSrc: Oral  PainSc: 0-No pain                 Rica Mast

## 2022-12-20 NOTE — Progress Notes (Signed)
Patient discharged home with family.  Discharge instructions, when to follow up, and prescriptions reviewed with patient.  Patient verbalized understanding. Patient will be escorted out by auxiliary.   

## 2022-12-20 NOTE — Lactation Note (Signed)
This note was copied from a baby's chart. Lactation Consultation Note  Patient Name: Vanessa Lindsey [id number] Date: 12/20/2022 Reason for consult: Follow-up assessment;Mother's request;Term Age:28 hours  Maternal Data Has patient been taught Hand Expression?: Yes Does the patient have breastfeeding experience prior to this delivery?: Yes How long did the patient breastfeed?: 9 months  Mom reports feeding on the Left side has been more difficult. No pain or discomfort noted when baby is latched and feeding on the right side. Mom does well with positioning on the right breast and breast support/sandwiching of the tissue. She has several pumps at home both electric and manual options.  Feeding Mother's Current Feeding Choice: Breast Milk  Baby has BF several times, voided and stooled before formula was given. Formula given overnight in place of latching on the L breast due to latch difficulties.  LATCH Score     Lactation Tools Discussed/Used Tools:  (A kit was given; unopened- pump not used)  Interventions Interventions: Position options;Support pillows;Breast feeding basics reviewed;Breast massage;Hand express;Pre-pump if needed;DEBP;Ice;Education;Pace feeding  LC and mom worked together to role play/demonstrate positioning for football hold on the L breast for breast acceptance from baby. We discussed baby's laying preference, comfort, and adding support pillows to bring baby to breast level. Encouraged mom to also hand express some colostrum onto the tip of nipple to encourage latch, and option of use of hand/manual pump to draw out nipple on L breast to prepare for latch.  Alternate feeding plan would be to continue to feed on R breast, and pump L breast if baby continues to refuse latch on that side. Mom has several pumps at home and is well versed in pump use.  Discharge Discharge Education: Engorgement and breast care;Warning signs for feeding baby;Outpatient  recommendation Pump: Manual;Hands Free;Personal WIC Program: Yes  Anticipatory guidance given for breast changes and management of breast fullness/engorgement- use of ice for swelling.  Consult Status Consult Status: Complete  Outpatient LC number provided. Encouraged to call with questions or set up for outpatient BF support.  Lavonia Drafts 12/20/2022, 10:16 AM

## 2022-12-20 NOTE — Discharge Instructions (Signed)

## 2022-12-20 NOTE — Final Progress Note (Signed)
Post Partum Day 1 Subjective: Vanessa Lindsey is feeling well overall. She is ambulating, voiding, and tolerating POs without difficulty. Her pain is well-controlled and her bleeding is WNL. Her mood is stable. Breastfeeding is going fairly well, although she is struggling to latch on the left side. She plans to see lactation today. She would like to go home this afternoon.    Objective: Blood pressure 114/74, pulse 100, temperature 98.4 F (36.9 C), temperature source Oral, resp. rate 20, last menstrual period 03/18/2022, SpO2 100 %, unknown if currently breastfeeding.  Physical Exam:  General: alert, cooperative, and appears stated age Heart: RRR Lungs: CTAB Abdomen: soft, non-tender, normal BS Lochia: appropriate Uterine Fundus: firm Incision: N/A DVT Evaluation: No evidence of DVT seen on physical exam.  Recent Labs    12/18/22 2113 12/20/22 0530  HGB 10.0* 8.9*  HCT 32.7* 29.3*    Assessment/Plan: Discharge home Lactation assistance PO iron for acute blood loss anemia Follow up: video visit in 2 weeks, PP office visit in 6 weeks. Desires Paragard. Needs follow up for ASCUS Pap/+HPV (colposcopy recommended) Discharge instructions reviewed   LOS: 2 days   Glenetta Borg, CNM 12/20/2022, 10:17 AM

## 2022-12-21 ENCOUNTER — Encounter: Payer: Self-pay | Admitting: Licensed Practical Nurse

## 2022-12-25 ENCOUNTER — Telehealth: Payer: Self-pay | Admitting: Advanced Practice Midwife

## 2022-12-25 NOTE — Telephone Encounter (Signed)
I contacted patient via phone. I left message for patient to call back to confirm scheduled appointment 01/11/23 at 9:35 am with Erskine Squibb for phone visit. The scheduled for Erskine Squibb wasn't opened for February to scheduled 6 week PP in office with Paraguard placement.

## 2022-12-25 NOTE — Telephone Encounter (Signed)
-----   Message from Glenetta Borg, CNM sent at 12/20/2022 10:20 AM EST ----- Regarding: Colposcopy Good morning!  When Jilliane calls to schedule her PP visit, she will also need to schedule a colposcopy for a month or 2 after that.  Thank you!  Missy

## 2023-01-11 ENCOUNTER — Ambulatory Visit (INDEPENDENT_AMBULATORY_CARE_PROVIDER_SITE_OTHER): Payer: Medicaid Other | Admitting: Advanced Practice Midwife

## 2023-01-11 ENCOUNTER — Encounter: Payer: Self-pay | Admitting: Advanced Practice Midwife

## 2023-01-11 NOTE — Progress Notes (Signed)
   Patient ID: Vanessa Lindsey, female   DOB: [birth year], 27 y.o.   MRN: [id number]  Reason for Consult: Postpartum Care (2 week)   Subjective   Virtual Visit via Telephone Note  I connected with Vanessa Lindsey on 01/11/23 at  9:55 AM EST by telephone and verified that I am speaking with the correct person using two identifiers.   I discussed the limitations, risks, security and privacy concerns of performing an evaluation and management service by telephone and the availability of in person appointments. I also discussed with the patient that there may be a patient responsible charge related to this service. The patient expressed understanding and agreed to proceed.  The patient was at home I spoke with the patient from my  office phone The names of people involved in this encounter were: Pharmacologist and myself Rod Can, CNM  The patient reports she is doing well at 2 weeks postpartum. She continues to breastfeed without problems. Bleeding is decreasing. No bowel or bladder concerns. Her mood is good. She reports newborn is doing well and her toddler is the one keeping her awake at night. She plans to use Paragard IUD for birth control.   Review of Systems  Constitutional:  Negative for chills and fever.  HENT:  Negative for congestion, ear discharge, ear pain, hearing loss, sinus pain and sore throat.   Eyes:  Negative for blurred vision and double vision.  Respiratory:  Negative for cough, shortness of breath and wheezing.   Cardiovascular:  Negative for chest pain, palpitations and leg swelling.  Gastrointestinal:  Negative for abdominal pain, blood in stool, constipation, diarrhea, heartburn, melena, nausea and vomiting.  Genitourinary:  Negative for dysuria, flank pain, frequency, hematuria and urgency.  Musculoskeletal:  Negative for back pain, joint pain and myalgias.  Skin:  Negative for itching and rash.  Neurological:  Negative for dizziness, tingling, tremors, sensory  change, speech change, focal weakness, seizures, loss of consciousness, weakness and headaches.  Endo/Heme/Allergies:  Negative for environmental allergies. Does not bruise/bleed easily.  Psychiatric/Behavioral:  Negative for depression, hallucinations, memory loss, substance abuse and suicidal ideas. The patient is not nervous/anxious and does not have insomnia.    No physical exam due to telephone visit  Assessment/Plan:     27 y.o. G2 P2 2 week postpartum telephone visit- no concerns  Return to clinic in 4 weeks for 6 week postpartum visit and Paragard IUD insertion   [facility] 01/11/2023, 1:24 PM

## 2023-01-22 ENCOUNTER — Telehealth: Payer: Self-pay

## 2023-01-22 NOTE — Telephone Encounter (Signed)
[facility]- Discharge Call Backs 1-Do you have any questions or concerns about yourself as you heal? No-Vag Del 2-Any concerns or questions about your baby? No Is your baby eating, peeing,pooping well?Yes 3-Where does your baby sleep in your home? Crib. Reviewed ABC's of safe sleep. 4-How was your stay at the [facility] 5-How did our team work together to care for you? Voiced concern about her decision to not have Internal Fetal Monitor placed.   You should be receiving a survey in the mail soon.   We would really appreciate it if you could fill that out for [place] and return it in the mail.  We value the feedback to make improvements and continue the great work we do.   If you have any questions please feel free to call me back at [phone]

## 2023-02-11 ENCOUNTER — Ambulatory Visit (INDEPENDENT_AMBULATORY_CARE_PROVIDER_SITE_OTHER): Payer: Medicaid Other | Admitting: Advanced Practice Midwife

## 2023-02-11 ENCOUNTER — Encounter: Payer: Self-pay | Admitting: Advanced Practice Midwife

## 2023-02-11 ENCOUNTER — Other Ambulatory Visit (HOSPITAL_COMMUNITY)
Admission: RE | Admit: 2023-02-11 | Discharge: 2023-02-11 | Disposition: A | Discharge status: Home / Self Care | Payer: Medicaid Other | Source: Ambulatory Visit | Attending: Advanced Practice Midwife | Admitting: Advanced Practice Midwife

## 2023-02-11 DIAGNOSIS — Z30430 Encounter for insertion of intrauterine contraceptive device: Secondary | ICD-10-CM

## 2023-02-11 DIAGNOSIS — Z3202 Encounter for pregnancy test, result negative: Secondary | ICD-10-CM

## 2023-02-11 DIAGNOSIS — Z975 Presence of (intrauterine) contraceptive device: Secondary | ICD-10-CM

## 2023-02-11 DIAGNOSIS — Z124 Encounter for screening for malignant neoplasm of cervix: Secondary | ICD-10-CM | POA: Insufficient documentation

## 2023-02-11 LAB — POCT URINE PREGNANCY: Preg Test, Ur: NEGATIVE

## 2023-02-11 MED ORDER — PARAGARD INTRAUTERINE COPPER IU IUD
1.0000 | INTRAUTERINE_SYSTEM | Freq: Once | INTRAUTERINE | Status: AC
  Administered 2023-02-11: 1 via INTRAUTERINE

## 2023-02-12 ENCOUNTER — Encounter: Payer: Self-pay | Admitting: Advanced Practice Midwife

## 2023-02-14 LAB — CYTOLOGY - PAP
Comment: NEGATIVE
Comment: NEGATIVE
Comment: NEGATIVE
Diagnosis: UNDETERMINED — AB
HPV 16: NEGATIVE
HPV 18 / 45: NEGATIVE
High risk HPV: POSITIVE — AB

## 2023-02-20 ENCOUNTER — Encounter: Payer: Self-pay | Admitting: Advanced Practice Midwife

## 2023-02-20 ENCOUNTER — Telehealth: Payer: Self-pay | Admitting: Advanced Practice Midwife

## 2023-02-20 NOTE — Telephone Encounter (Signed)
I will discuss this with the patient further when she comes for her appointment.  Can apply topical anesthesia at time of the procedure if needed.

## 2023-02-20 NOTE — Patient Instructions (Signed)
Copper Intrauterine Device (IUD) What is this medication? COPPER IUD (KOP er EYE YOU DEE) prevents pregnancy. It works by using copper to prevent sperm from reaching the egg (fertilization) without hormones. It is a reversible, long-term contraceptive. This medicine may be used for other purposes; ask your health care provider or pharmacist if you have questions. COMMON BRAND NAME(S): ParaGard T380A What should I tell my care team before I take this medication? They need to know if you have any of these conditions: Abnormal Pap smear Cancer, including leukemia, uterine cancer, cervical cancer Condition of the uterus that changes its shape, such as large fibroid tumors Endometriosis Genital or pelvic infection now or in the past 3 months Have more than one sexual partner or your partner has more than one partner History of an ectopic or tubal pregnancy Immune system problems Intrauterine system already in your uterus Pelvic inflammatory disease (PID) Sexually transmitted infection (STI) Substance abuse disorder Unexplained vaginal bleeding An unusual or allergic reaction to copper, barium sulfate, polyethylene, other medications, foods, dyes, or preservatives Pregnant or trying to get pregnant Breast-feeding How should I use this medication? This device is placed inside the uterus by your care team. A patient package insert for the product will be given each time it is inserted. Be sure to read this information carefully each time. The sheet may change often. Talk to your care team about use of this medication in children. Special care may be needed. Overdosage: If you think you have taken too much of this medicine contact a poison control center or emergency room at once. NOTE: This medicine is only for you. Do not share this medicine with others. What if I miss a dose? This does not apply. The device will need to be replaced every 10 years if you wish to continue using this type of  contraception. What may interact with this medication? Interactions are not expected. This list may not describe all possible interactions. Give your health care provider a list of all the medicines, herbs, non-prescription drugs, or dietary supplements you use. Also tell them if you smoke, drink alcohol, or use illegal drugs. Some items may interact with your medicine. What should I watch for while using this medication? Visit your care team for regular check-ups. Talk to your care team if you wish to become pregnant or think you might be pregnant. The IUD will need to be removed. The IUD does not protect you or your partner against HIV or any other sexually transmitted infections. Tell your care team if you or your partner becomes HIV positive or gets a sexually transmitted infection. You can check the placement of the IUD yourself by reaching up to the top of your vagina with clean fingers to feel the threads. Do not pull on the threads. It is a good habit to check placement after each menstrual period. Call your care team right away if you feel more of the IUD than just the threads or if you cannot feel the threads at all. The IUD may come out by itself. You may become pregnant if the device comes out. If you notice that the IUD has come out use a backup contraceptive method, such as condoms, and call your care team. Using tampons will not change the position of the IUD. It is okay to use tampons during your menstrual period. This IUD can be safely scanned with magnetic resonance imaging (MRI) only under specific conditions. Before you have an MRI, tell your care team that  you have an IUD in place, and which type of IUD you have in place. What side effects may I notice from receiving this medication? Side effects that you should report to your care team as soon as possible: Allergic reactions--skin rash, itching, hives, swelling of the face, lips, tongue, or throat Heavy vaginal bleeding Low red  blood cell level--unusual weakness or fatigue, dizziness, headache, trouble breathing Pelvic inflammatory disease (PID)--fever, abdominal pain, pelvic pain, pain or trouble passing urine, spotting, bleeding during or after sex Seizures Slow heartbeat--dizziness, feeling faint or lightheaded, confusion, trouble breathing, unusual weakness or fatigue Stomach pain, unusual weakness or fatigue, nausea, vomiting, diarrhea, or fever that lasts longer than expected Unusual vaginal discharge, itching, or odor Vaginal pain, irritation, or sores Side effects that usually do not require medical attention (report these to your care team if they continue or are bothersome): Back pain Irregular menstrual cycles or spotting Menstrual cramps Vaginal discharge This list may not describe all possible side effects. Call your doctor for medical advice about side effects. You may report side effects to FDA at 1-800-FDA-1088. Where should I keep my medication? This does not apply. NOTE: This sheet is a summary. It may not cover all possible information. If you have questions about this medicine, talk to your doctor, pharmacist, or health care provider.  2023 Elsevier/Gold Standard (2022-02-13 00:00:00)

## 2023-02-20 NOTE — Telephone Encounter (Signed)
I contacted this patient via phone. I advised patient about scheduling the Colposcopy appointment. The patient asked what the procedure was I explained it was a poss biopsy of the cervix. The patient states" she isn't going thru this procedure with out local anesthesia!". I was able to get patient scheduled for first available appointment with Dr Marcelline Mates on 3/21. Please advise if we can offer something for this patient?

## 2023-02-20 NOTE — Progress Notes (Signed)
[facility]  Date of Service: 02/11/2023  Postpartum Visit  Chief Complaint: 6 week postpartum visit  History of Present Illness: Patient is a 27 y.o. [id number] presents for postpartum visit.  Review the Delivery Report for details.   Date of delivery: 12/19/2022 Type of delivery: Vaginal delivery - Vacuum or forceps assisted  no Episiotomy No.  Laceration: no  Pregnancy or labor problems:  no Any problems since the delivery:  no  Newborn Details:  SINGLETON :  1. BabyGender female. Birth weight: 6 pounds 6 ounces Maternal Details:  Breast or formula feeding: breastfeeding Intercourse: No  Contraception after delivery:  Paragard IUD today Any bowel or bladder issues: No  Post partum depression/anxiety noted:  no Edinburgh Post-Partum Depression Score: 3 Date of last PAP: 07/25/22  ASCUS with POSITIVE high risk HPV    Review of Systems: Review of Systems  Constitutional:  Negative for chills and fever.  HENT:  Negative for congestion, ear discharge, ear pain, hearing loss, sinus pain and sore throat.   Eyes:  Negative for blurred vision and double vision.  Respiratory:  Negative for cough, shortness of breath and wheezing.   Cardiovascular:  Negative for chest pain, palpitations and leg swelling.  Gastrointestinal:  Negative for abdominal pain, blood in stool, constipation, diarrhea, heartburn, melena, nausea and vomiting.  Genitourinary:  Negative for dysuria, flank pain, frequency, hematuria and urgency.  Musculoskeletal:  Negative for back pain, joint pain and myalgias.  Skin:  Negative for itching and rash.  Neurological:  Negative for dizziness, tingling, tremors, sensory change, speech change, focal weakness, seizures, loss of consciousness, weakness and headaches.  Endo/Heme/Allergies:  Negative for environmental allergies. Does not bruise/bleed easily.  Psychiatric/Behavioral:  Negative for depression, hallucinations, memory loss, substance abuse and suicidal ideas.  The patient is not nervous/anxious and does not have insomnia.      Past Medical History:  Past Medical History:  Diagnosis Date   Allergy    Asthma    Chickenpox     Past Surgical History:  Past Surgical History:  Procedure Laterality Date   NO PAST SURGERIES      Family History:  Family History  Problem Relation Age of Onset   Hypothyroidism Mother    Heart attack Father    Diabetes Father    Hyperlipidemia Father    Cancer Maternal Grandmother        Unsure   Breast cancer Neg Hx    Ovarian cancer Neg Hx    Colon cancer Neg Hx     Social History:  Social History   Socioeconomic History   Marital status: Single    Spouse name: Not on file   Number of children: Not on file   Years of education: Not on file   Highest education level: Not on file  Occupational History   Not on file  Tobacco Use   Smoking status: Some Days    Packs/day: 0.25    Types: Cigarettes   Smokeless tobacco: Never   Tobacco comments:    Declines cessation materials or support at this time  Vaping Use   Vaping Use: Some days   Substances: Nicotine  Substance and Sexual Activity   Alcohol use: Not Currently   Drug use: No   Sexual activity: Not Currently    Birth control/protection: None  Other Topics Concern   Not on file  Social History Narrative   Single.   No children.   Works at [employer].   Enjoys shopping, spending time  with friends.   Social Determinants of Health   Financial Resource Strain: Medium Risk (05/21/2022)   Overall Financial Resource Strain (CARDIA)    Difficulty of Paying Living Expenses: Somewhat hard  Food Insecurity: No Food Insecurity (12/18/2022)   Hunger Vital Sign    Worried About Running Out of Food in the Last Year: Never true    Ran Out of Food in the Last Year: Never true  Transportation Needs: No Transportation Needs (12/18/2022)   PRAPARE - Hydrologist (Medical): No    Lack of Transportation  (Non-Medical): No  Physical Activity: Not on file  Stress: Not on file  Social Connections: Moderately Integrated (05/21/2022)   Social Connection and Isolation Panel [NHANES]    Frequency of Communication with Friends and Family: More than three times a week    Frequency of Social Gatherings with Friends and Family: Once a week    Attends Religious Services: 1 to 4 times per year    Active Member of [employer] or Organizations: No    Attends Archivist Meetings: Never    Marital Status: Living with partner  Intimate Partner Violence: Not At Risk (12/18/2022)   Humiliation, Afraid, Rape, and Kick questionnaire    Fear of Current or Ex-Partner: No    Emotionally Abused: No    Physically Abused: No    Sexually Abused: No    Allergies:  Allergies  Allergen Reactions   Sulfa Antibiotics Hives    Medications: Prior to Admission medications   Medication Sig Start Date End Date Taking? Authorizing Provider  albuterol (VENTOLIN HFA) 108 (90 Base) MCG/ACT inhaler Inhale 2 puffs into the lungs every 6 (six) hours as needed. 09/09/19  Yes Shambley, Melody N, CNM  Iron-FA-B Cmp-C-Biot-Probiotic (FUSION PLUS) CAPS Take by mouth.   Yes [provider]  Prenatal Vit-Fe Phos-FA-Omega (VITAFOL GUMMIES) 3.33-0.333-34.8 MG CHEW Chew 3 each by mouth daily. 09/24/22  Yes Lurlean Horns, CNM    Physical Exam Blood pressure 105/74, pulse 89, height 5' 1"$  (1.549 m), weight 213 lb 9.6 oz (96.9 kg), currently breastfeeding.    General: NAD HEENT: normocephalic, anicteric Pulmonary: No increased work of breathing Abdomen: NABS, soft, non-tender, non-distended.  Umbilicus without lesions.  No hepatomegaly, splenomegaly or masses palpable. No evidence of hernia. Genitourinary:  External: Normal external female genitalia.  Normal urethral meatus, normal Bartholin's and Skene's glands.    Vagina: Normal vaginal mucosa, no evidence of prolapse.    Cervix: Grossly normal in appearance, no  bleeding  Uterus: Non-enlarged, mobile, normal contour.  No CMT  Adnexa: ovaries non-enlarged, no adnexal masses  Rectal: deferred Extremities: no edema, erythema, or tenderness Neurologic: Grossly intact Psychiatric: mood appropriate, affect full    Assessment: 27 y.o. [id number] presenting for 6 week postpartum visit  Plan: Problem List Items Addressed This Visit   None Visit Diagnoses     6 weeks postpartum follow-up    -  Primary   Relevant Orders   Cytology - PAP (Completed)   IUD contraception       Relevant Medications   paragard intrauterine copper IUD 1 each (Completed)   Other Relevant Orders   POCT urine pregnancy (Completed)   Screening for cervical cancer       Relevant Orders   Cytology - PAP (Completed)   Encounter for IUD insertion [Z30.430]            1) Contraception - Education given regarding options for contraception, as well as compatibility with  breast feeding if applicable.  Patient plans on IUD for contraception.  2)  Pap - ASCCP guidelines and rationale discussed.  Patient opts for every 3 years screening interval following normal PAP  3) Patient underwent screening for postpartum depression with no signs of depression  4) Return in about 1 year (around 02/12/2024) for annual established gyn.   Rod Can, [facility] 02/20/2023, 1:17 PM

## 2023-02-20 NOTE — Progress Notes (Signed)
[facility]  Date of Service: 02/11/2023  GYNECOLOGY OFFICE PROCEDURE NOTE  Vanessa Lindsey is a 27 y.o. [id number] here for Paragard IUD insertion. No GYN concerns.  Last pap smear was on 07/25/22 and was ASCUS/HR HPV. Repeat PAP today. The patient is currently using abstinence for contraception and her LMP is No LMP recorded (lmp unknown)..  The indication for her IUD is contraception/cycle control.  IUD Insertion Procedure Note Patient identified, informed consent performed, consent signed.   Discussed risks of irregular bleeding, cramping, infection, malpositioning, expulsion or uterine perforation of the IUD (1:1000 placements)  which may require further procedure such as laparoscopy.  IUD while effective at preventing pregnancy do not prevent transmission of sexually transmitted diseases and use of barrier methods for this purpose was discussed. Time out was performed.  Urine pregnancy test negative.  Speculum placed in the vagina.  Cervix visualized.  Cleaned with Betadine x 2.  Grasped anteriorly with a single tooth tenaculum.  Uterus sounded to 5 cm. IUD placed per manufacturer's recommendations.  Strings trimmed to 3 cm. Tenaculum was removed, good hemostasis noted.  Patient tolerated procedure well.   Patient was given post-procedure instructions.  She was advised to have backup contraception for one week.  Patient was also asked to check IUD strings periodically and follow up in 4-6 weeks for IUD check. The patient is aware of minimal sounding size of uterus and risk for possible expulsion.  IUD insertion CPT 58300,  Skyla [id number] Mirena [id number] [employer] [id number] Verdia Kuba [id number] Modifer 25, plus Modifer 79 is done during a global billing visit    Rod Can, [facility]

## 2023-02-27 NOTE — Telephone Encounter (Signed)
I contacted the patient via phone. I didn't leave message for patient. Please advise patient when she calls back what Dr. Marcelline Mates advised about patient's appointment for [place].

## 2023-03-14 ENCOUNTER — Ambulatory Visit: Payer: Medicaid Other | Admitting: Advanced Practice Midwife

## 2023-03-19 NOTE — Telephone Encounter (Signed)
TRIAGE VOICEMAIL: Patient has two questions about the procedure she has scheduled for Thursday that she is going to be rescheduling. [phone]

## 2023-03-19 NOTE — Telephone Encounter (Signed)
Left voicemail that I'm returning her call. Our office is now closed. I will try her again tomorrow.

## 2023-03-20 NOTE — Telephone Encounter (Signed)
Spoke with patient. Answered questions regarding colposcopy. Advised will send information on colposcopy via mychart. Patient reports she needs to reschedule due to her kids being sick. Transferred to Carloyn Jaeger for rescheduling.

## 2023-03-21 ENCOUNTER — Ambulatory Visit: Payer: Medicaid Other | Admitting: Obstetrics and Gynecology

## 2023-04-29 NOTE — Progress Notes (Deleted)
    GYNECOLOGY OFFICE COLPOSCOPY PROCEDURE NOTE  27 y.o. [id number] here for colposcopy for ASCUS with POSITIVE high risk HPV pap smear on 02/11/2023. Discussed role for HPV in cervical dysplasia, need for surveillance.  Patient gave informed written consent, time out was performed.  Placed in lithotomy position. Cervix viewed with speculum and colposcope after application of acetic acid.   Colposcopy adequate? {yes/no:20286}  {Findings; colposcopy:728}; corresponding biopsies obtained.  ECC specimen obtained. All specimens were labeled and sent to pathology.  Chaperone was present during entire procedure.  Patient was given post procedure instructions.  Will follow up pathology and manage accordingly; patient will be contacted with results and recommendations.  Routine preventative health maintenance measures emphasized.       GYNECOLOGY OFFICE ENCOUNTER NOTE  History:  27 y.o. [id number] here today for today for IUD string check; {IUD Type:19197::"Mirena","Liletta","Kyleena","Paragard"}  IUD was placed  ***. No complaints about the IUD, no concerning side effects.  The following portions of the patient's history were reviewed and updated as appropriate: allergies, current medications, past family history, past medical history, past social history, past surgical history and problem list. Last pap smear on *** was normal, negative HRHPV.  Review of Systems:  Pertinent items are noted in HPI.  Objective:  Physical Exam currently breastfeeding. CONSTITUTIONAL: Well-developed, well-nourished female in no acute distress.  NEUROLOGIC: Alert and oriented to person, place, and time. Normal reflexes, muscle tone coordination.  ABDOMEN: Soft, no distention noted.   PELVIC: Normal appearing external genitalia; normal appearing vaginal mucosa and cervix.  IUD strings visualized, about *** cm in length outside cervix. Done in the presence of a chaperone.  EXTREMITIES: Non-tender, no edema or  cyanosis  Assessment & Plan:  Patient to keep IUD in place for up to *** years; can come in for removal if she desires pregnancy earlier or for any concerning side effects.    Hildred Laser, MD [facility]

## 2023-04-30 ENCOUNTER — Ambulatory Visit: Payer: Medicaid Other | Admitting: Obstetrics and Gynecology

## 2023-05-09 DIAGNOSIS — K000 Anodontia: Secondary | ICD-10-CM | POA: Diagnosis not present

## 2023-12-19 ENCOUNTER — Encounter: Payer: Self-pay | Admitting: Licensed Practical Nurse

## 2023-12-19 ENCOUNTER — Emergency Department
Admission: EM | Admit: 2023-12-19 | Discharge: 2023-12-19 | Disposition: A | Discharge status: Home / Self Care | Payer: Medicaid Other | Attending: Emergency Medicine | Admitting: Emergency Medicine

## 2023-12-19 ENCOUNTER — Ambulatory Visit: Payer: Medicaid Other | Admitting: Licensed Practical Nurse

## 2023-12-19 ENCOUNTER — Emergency Department: Payer: Medicaid Other

## 2023-12-19 ENCOUNTER — Other Ambulatory Visit: Payer: Self-pay

## 2023-12-19 VITALS — BP 106/75 | HR 106 | Ht 61.0 in | Wt 213.3 lb

## 2023-12-19 DIAGNOSIS — Z30432 Encounter for removal of intrauterine contraceptive device: Secondary | ICD-10-CM | POA: Diagnosis not present

## 2023-12-19 DIAGNOSIS — Y828 Other medical devices associated with adverse incidents: Secondary | ICD-10-CM | POA: Diagnosis not present

## 2023-12-19 DIAGNOSIS — Z30011 Encounter for initial prescription of contraceptive pills: Secondary | ICD-10-CM

## 2023-12-19 DIAGNOSIS — T8332XA Displacement of intrauterine contraceptive device, initial encounter: Secondary | ICD-10-CM | POA: Diagnosis not present

## 2023-12-19 LAB — POC URINE PREG, ED: Preg Test, Ur: NEGATIVE

## 2023-12-19 MED ORDER — NORETHINDRONE 0.35 MG PO TABS: 1.0000 | ORAL_TABLET | Freq: Every day | ORAL | 11 refills | Status: DC

## 2023-12-19 NOTE — ED Triage Notes (Signed)
Pt reports she can feel the strings of her IUD coming out of her vagina, pt is having abd cramping and vaginal bleeding. IUD was placed 10 months ago.

## 2023-12-19 NOTE — Discharge Instructions (Signed)
As we discussed, the ultrasound showed that your IUD has moved lower than it normally sits in your uterus, but it is not causing any dangerous problem at this time.  You declined to have [place] remove the IUD in the emergency department, but we recommend you follow-up with OB/GYN at the next available opportunity to discuss having it removed.

## 2023-12-19 NOTE — Progress Notes (Signed)
    GYNECOLOGY OFFICE PROCEDURE NOTE  Vanessa Lindsey is a 27 y.o. [id number] here for Liletta IUD removal. No GYN concerns.  Last pap smear was on 2/24 and ASC-US HPV pos  She was seen in the ED today for d/t abdominal/pelvic discomfort and noticed her IUD strings were longer than they have been. [place] shoed the IUD 2cm from the fundus, the ED provider offered to remove the IUD then, pt preferred to come here.   Vanessa Lindsey last had IC 2 weeks ago. She recently ended her relationship with her partner. She desires contraception, she does vape and her current BMI is >40.    OBJECTIVE: BP 106/75 (BP Location: Left Arm, Patient Position: Sitting)   Pulse (!) 106   Ht 5\' 1"  (1.549 m)   Wt 213 lb 4.8 oz (96.8 kg)   BMI 40.30 kg/m  GEN: NAD, tearful when addressing need to colpo. Pt admits she has "a lot going on"   Assessment/Plan:  IUD Removal  Patient identified, informed consent performed, consent signed.  Patient was in the dorsal lithotomy position, normal external genitalia was noted.  A speculum was placed in the patient's vagina, normal discharge was noted, no lesions. The cervix was visualized, no lesions, no abnormal discharge.  The strings of the IUD were grasped and pulled using ring forceps. The IUD was removed in its entirety and shown to pt. Patient tolerated the procedure well.    -Patient will use POP's  for contraception. Script sent to pharmacy   -Discussed vaping and BMI >30 are contraindications to combined methods. Vanessa Lindsey is very interested in the Nuvaring, she is committed to stopping vaping and is interested in losing weight.   -last pap noted to be ASC-US with HPV positive, needs colpo, Vanessa Lindsey has been resistant to schedule as she is scared. Encouraged  pt to schedule when the time is right for her.   Vanessa Lindsey, CNM  [facility]  12/19/23  4:53 PM

## 2023-12-19 NOTE — ED Provider Notes (Signed)
[facility] Provider Note    Event Date/Time   First MD Initiated Contact with Patient 12/19/23 [phone]     (approximate)   History   IUD Problem   HPI Vanessa Lindsey is a 27 y.o. female who presents with concerns about misplaced IUD.  She said that her IUD was placed about 10 months ago at [facility].  Today she noticed that the strings are coming out of her vagina.  She has also been having a little bit of abdominal/pelvic cramping that feels similar to a menstrual cycle.  She otherwise feels okay.  She denies fever, nausea, and vomiting.     Physical Exam   Triage Vital Signs: ED Triage Vitals  Encounter Vitals Group     BP 12/19/23 0136 128/63     Systolic BP Percentile --      Diastolic BP Percentile --      Pulse Rate 12/19/23 0136 95     Resp 12/19/23 0136 20     Temp 12/19/23 0136 98.4 F (36.9 C)     Temp src --      SpO2 12/19/23 0136 97 %     Weight 12/19/23 0136 117.9 kg (260 lb)     Height 12/19/23 0136 1.549 m (5\' 1" )     Head Circumference --      Peak Flow --      Pain Score 12/19/23 0136 10     Pain Loc --      Pain Education --      Exclude from Growth Chart --     Most recent vital signs: Vitals:   12/19/23 0136  BP: 128/63  Pulse: 95  Resp: 20  Temp: 98.4 F (36.9 C)  SpO2: 97%    General: Awake, no distress.  CV:  Good peripheral perfusion.  Resp:  Normal effort. Speaking easily and comfortably, no accessory muscle usage nor intercostal retractions.   Abd:  No distention.  No tenderness to palpation throughout the abdomen. Other:  Deferred GU exam.   ED Results / Procedures / Treatments   Labs (all labs ordered are listed, but only abnormal results are displayed) Labs Reviewed  POC URINE PREG, ED      RADIOLOGY I viewed and interpreted the patient's ultrasound and I can see the IUD in place.  Radiologist confirms that it is lower than anticipated by about 2 cm but there is no evidence of  perforation.   PROCEDURES:  Critical Care performed: No  Procedures    IMPRESSION / MDM / ASSESSMENT AND PLAN / ED COURSE  I reviewed the triage vital signs and the nursing notes.                              Differential diagnosis includes, but is not limited to, IUD malfunction, IUD misplacement, uterine perforation.  Patient's presentation is most consistent with acute presentation with potential threat to life or bodily function.  Labs/studies ordered: Pelvic ultrasound  Interventions/Medications given:  Medications - No data to display  (Note:  hospital course my include additional interventions and/or labs/studies not listed above.)   Stable vital signs, reassuring abdominal exam.  Ultrasound indicates migration of the IUD but without any perforation or emergent condition.  I offered to perform pelvic exam and remove the IUD but the patient would prefer to follow-up with OB/GYN which I think is reasonable.  I gave my usual and customary follow-up  recommendations and return precautions.         FINAL CLINICAL IMPRESSION(S) / ED DIAGNOSES   Final diagnoses:  IUD migration, initial encounter     Rx / DC Orders   ED Discharge Orders     None        Note:  This document was prepared using Dragon voice recognition software and may include unintentional dictation errors.   Loleta Rose, MD 12/19/23 [phone]

## 2024-02-18 NOTE — Progress Notes (Deleted)
    GYNECOLOGY OFFICE COLPOSCOPY PROCEDURE NOTE  28 y.o. [id number] here for colposcopy for ASCUS with POSITIVE high risk HPV pap smear on 02/11/2023. Discussed role for HPV in cervical dysplasia, need for surveillance.  Patient gave informed written consent, time out was performed.  Placed in lithotomy position. Cervix viewed with speculum and colposcope after application of acetic acid.   Colposcopy adequate? {yes/no:20286}  {Findings; colposcopy:728}; corresponding biopsies obtained.  ECC specimen obtained. All specimens were labeled and sent to pathology.  Chaperone was present during entire procedure.  Patient was given post procedure instructions.  Will follow up pathology and manage accordingly; patient will be contacted with results and recommendations.  Routine preventative health maintenance measures emphasized.    Hildred Laser, MD [facility]

## 2024-02-19 ENCOUNTER — Ambulatory Visit: Payer: Medicaid Other | Admitting: Obstetrics and Gynecology

## 2024-09-17 ENCOUNTER — Ambulatory Visit: Admitting: Physician Assistant

## 2024-10-15 ENCOUNTER — Ambulatory Visit: Admitting: Physician Assistant

## 2024-10-20 ENCOUNTER — Encounter: Payer: Self-pay | Admitting: Emergency Medicine

## 2024-10-20 ENCOUNTER — Other Ambulatory Visit: Payer: Self-pay

## 2024-10-20 ENCOUNTER — Emergency Department: Payer: Self-pay

## 2024-10-20 ENCOUNTER — Emergency Department
Admission: EM | Admit: 2024-10-20 | Discharge: 2024-10-20 | Disposition: A | Discharge status: Home / Self Care | Payer: Self-pay | Attending: Emergency Medicine | Admitting: Emergency Medicine

## 2024-10-20 DIAGNOSIS — G43009 Migraine without aura, not intractable, without status migrainosus: Secondary | ICD-10-CM | POA: Insufficient documentation

## 2024-10-20 DIAGNOSIS — O26899 Other specified pregnancy related conditions, unspecified trimester: Secondary | ICD-10-CM | POA: Insufficient documentation

## 2024-10-20 DIAGNOSIS — D72829 Elevated white blood cell count, unspecified: Secondary | ICD-10-CM | POA: Insufficient documentation

## 2024-10-20 DIAGNOSIS — J45909 Unspecified asthma, uncomplicated: Secondary | ICD-10-CM | POA: Insufficient documentation

## 2024-10-20 DIAGNOSIS — Z3201 Encounter for pregnancy test, result positive: Secondary | ICD-10-CM | POA: Insufficient documentation

## 2024-10-20 DIAGNOSIS — O99350 Diseases of the nervous system complicating pregnancy, unspecified trimester: Secondary | ICD-10-CM | POA: Insufficient documentation

## 2024-10-20 DIAGNOSIS — Z3A00 Weeks of gestation of pregnancy not specified: Secondary | ICD-10-CM | POA: Insufficient documentation

## 2024-10-20 HISTORY — DX: Migraine, unspecified, not intractable, without status migrainosus: G43.909

## 2024-10-20 LAB — COMPREHENSIVE METABOLIC PANEL WITH GFR
ALT: 13 U/L (ref 0–44)
AST: 14 U/L — ABNORMAL LOW (ref 15–41)
Albumin: 3.4 g/dL — ABNORMAL LOW (ref 3.5–5.0)
Alkaline Phosphatase: 98 U/L (ref 38–126)
Anion gap: 11 (ref 5–15)
BUN: <5 mg/dL — ABNORMAL LOW (ref 6–20)
CO2: 20 mmol/L — ABNORMAL LOW (ref 22–32)
Calcium: 8.6 mg/dL — ABNORMAL LOW (ref 8.9–10.3)
Chloride: 100 mmol/L (ref 98–111)
Creatinine, Ser: 0.51 mg/dL (ref 0.44–1.00)
GFR, Estimated: >60 mL/min (ref >60)
Glucose, Bld: 101 mg/dL — ABNORMAL HIGH (ref 70–99)
Potassium: 3.7 mmol/L (ref 3.5–5.1)
Sodium: 131 mmol/L — ABNORMAL LOW (ref 135–145)
Total Bilirubin: 0.7 mg/dL (ref 0.0–1.2)
Total Protein: 7.2 g/dL (ref 6.5–8.1)

## 2024-10-20 LAB — CBC
HCT: 32.7 % — ABNORMAL LOW (ref 36.0–46.0)
Hemoglobin: 10 g/dL — ABNORMAL LOW (ref 12.0–15.0)
MCH: 21.7 pg — ABNORMAL LOW (ref 26.0–34.0)
MCHC: 30.6 g/dL (ref 30.0–36.0)
MCV: 70.9 fL — ABNORMAL LOW (ref 80.0–100.0)
Platelets: 361 K/uL (ref 150–400)
RBC: 4.61 MIL/uL (ref 3.87–5.11)
RDW: 16.3 % — ABNORMAL HIGH (ref 11.5–15.5)
WBC: 11 K/uL — ABNORMAL HIGH (ref 4.0–10.5)
nRBC: 0 % (ref 0.0–0.2)

## 2024-10-20 LAB — RESP PANEL BY RT-PCR (RSV, FLU A&B, COVID)  RVPGX2
Influenza A by PCR: NEGATIVE
Influenza B by PCR: NEGATIVE
Resp Syncytial Virus by PCR: NEGATIVE
SARS Coronavirus 2 by RT PCR: NEGATIVE

## 2024-10-20 LAB — URINALYSIS, ROUTINE W REFLEX MICROSCOPIC
Bilirubin Urine: NEGATIVE
Glucose, UA: NEGATIVE mg/dL
Hgb urine dipstick: NEGATIVE
Ketones, ur: 20 mg/dL — AB
Leukocytes,Ua: NEGATIVE
Nitrite: NEGATIVE
Protein, ur: NEGATIVE mg/dL
Specific Gravity, Urine: 1.017 (ref 1.005–1.030)
pH: 7 (ref 5.0–8.0)

## 2024-10-20 LAB — POC URINE PREG, ED: Preg Test, Ur: POSITIVE — AB

## 2024-10-20 MED ORDER — DIPHENHYDRAMINE HCL 50 MG/ML IJ SOLN
25.0000 mg | Freq: Once | INTRAMUSCULAR | Status: AC
  Administered 2024-10-20: 25 mg via INTRAVENOUS
  Filled 2024-10-20: qty 1

## 2024-10-20 MED ORDER — ACETAMINOPHEN 500 MG PO TABS
1000.0000 mg | ORAL_TABLET | Freq: Once | ORAL | Status: AC
  Administered 2024-10-20: 1000 mg via ORAL
  Filled 2024-10-20: qty 2

## 2024-10-20 MED ORDER — SODIUM CHLORIDE 0.9 % IV BOLUS
500.0000 mL | Freq: Once | INTRAVENOUS | Status: AC
  Administered 2024-10-20: 500 mL via INTRAVENOUS

## 2024-10-20 MED ORDER — METOCLOPRAMIDE HCL 5 MG/ML IJ SOLN
10.0000 mg | Freq: Once | INTRAMUSCULAR | Status: AC
  Administered 2024-10-20: 10 mg via INTRAVENOUS
  Filled 2024-10-20: qty 2

## 2024-10-20 MED ORDER — MAGNESIUM SULFATE 2 GM/50ML IV SOLN
2.0000 g | Freq: Once | INTRAVENOUS | Status: AC
  Administered 2024-10-20: 2 g via INTRAVENOUS
  Filled 2024-10-20: qty 50

## 2024-10-20 NOTE — ED Provider Notes (Signed)
 [facility] Provider Note    Event Date/Time   First MD Initiated Contact with Patient 10/20/24 1823     (approximate)   History   Migraine   HPI  Akasha H Hutchinson is a 28 y.o. female  with a past medical history of migraines, asthma presents to the emergency department with headache and pain on both sides of her neck for 2 weeks, but reports the headache has been continuous for the past 3 days.  She has had 2 episodes of emesis today, myalgias, and has been eating and drinking much less than her usual. Believes she may be pregnant.  Reports her daughter has been sick over the last week and believes she is having the same symptoms. Patient denies fever, chest pain, sore throat, SOB, cough, vision changes. Did have some sinus congestion but this has resolved.   Physical Exam   Triage Vital Signs: ED Triage Vitals [10/20/24 1807]  Encounter Vitals Group     BP 125/73     Girls Systolic BP Percentile      Girls Diastolic BP Percentile      Boys Systolic BP Percentile      Boys Diastolic BP Percentile      Pulse Rate (!) 105     Resp 17     Temp 98.4 F (36.9 C)     Temp Source Oral     SpO2 100 %     Weight 180 lb (81.6 kg)     Height 5' 1 (1.549 m)     Head Circumference      Peak Flow      Pain Score 10     Pain Loc      Pain Education      Exclude from Growth Chart     Most recent vital signs: Vitals:   10/20/24 2035 10/20/24 2036  BP: 112/70   Pulse: 87 87  Resp: 18   Temp:    SpO2: 100%     General: Awake, in no acute distress.  Eating ice chips in the room. Head: Normocephalic, atraumatic. Eyes: PERRLA. EOMs intact. No scleral icterus or conjunctival injection. Ears/Nose/Throat: TMs intact b/l. Nares patent, no nasal discharge. Oropharynx moist, no erythema or exudate. Dentition intact. Neck: Supple, no lymphadenopathy, no nuchal rigidity. CV: Good peripheral perfusion. No edema.  Respiratory:Normal respiratory effort.  No  respiratory distress. CTAB. GI: Soft, non-distended, non-tender. No rebound or guarding. MSK: Normal ROM and  5/5 strength in b/l upper and lower extremities.  Skin:Warm, dry, intact. No rashes, lesions, or ecchymosis. No cyanosis or pallor. Neurological: A&Ox4 to person, place, time, and situation. Cranial nerves II-XII grossly intact. Sensation intact. Strength symmetric. No focal deficits.  No midline cervical tenderness.  ED Results / Procedures / Treatments   Labs (all labs ordered are listed, but only abnormal results are displayed) Labs Reviewed  CBC - Abnormal; Notable for the following components:      Result Value   WBC 11.0 (*)    Hemoglobin 10.0 (*)    HCT 32.7 (*)    MCV 70.9 (*)    MCH 21.7 (*)    RDW 16.3 (*)    All other components within normal limits  COMPREHENSIVE METABOLIC PANEL WITH GFR - Abnormal; Notable for the following components:   Sodium 131 (*)    CO2 20 (*)    Glucose, Bld 101 (*)    BUN <5 (*)    Calcium 8.6 (*)    Albumin  3.4 (*)    AST 14 (*)    All other components within normal limits  URINALYSIS, ROUTINE W REFLEX MICROSCOPIC - Abnormal; Notable for the following components:   Color, Urine YELLOW (*)    APPearance CLEAR (*)    Ketones, ur 20 (*)    All other components within normal limits  POC URINE PREG, ED - Abnormal; Notable for the following components:   Preg Test, Ur POSITIVE (*)    All other components within normal limits  RESP PANEL BY RT-PCR (RSV, FLU A&B, COVID)  [id number]     EKG     RADIOLOGY CT head and cervical spine ordered.   PROCEDURES:  Critical Care performed: No   Procedures   MEDICATIONS ORDERED IN ED: Medications  sodium chloride  0.9 % bolus 500 mL (0 mLs Intravenous Stopped 10/20/24 2036)  metoCLOPramide (REGLAN) injection 10 mg (10 mg Intravenous Given 10/20/24 2102)  diphenhydrAMINE  (BENADRYL ) injection 25 mg (25 mg Intravenous Given 10/20/24 2058)  acetaminophen  (TYLENOL ) tablet 1,000 mg (1,000  mg Oral Given 10/20/24 2057)  sodium chloride  0.9 % bolus 500 mL (0 mLs Intravenous Stopped 10/20/24 2224)  magnesium  sulfate IVPB 2 g 50 mL (0 g Intravenous Stopped 10/20/24 2208)     IMPRESSION / MDM / ASSESSMENT AND PLAN / ED COURSE  I reviewed the triage vital signs and the nursing notes.                              Differential diagnosis includes, but is not limited to, pregnancy test, complex migraine, flu, COVID, RSV, intracranial mass, gastroenteritis, sinusitis  Patient's presentation is most consistent with acute complicated illness / injury requiring diagnostic workup.  Patient is a 28 year old female with signs and symptoms as described above.  Urine pregnancy test is positive.  Patient was taken back for CT scan of head and cervical spine before urine pregnancy test resulted; discussed this with the patient.  CT scans were negative for any acute intracranial or acute cervical findings.  Respiratory panel negative.  UA with elevated ketones of 20, otherwise reassuring.  Patient states she has been feeling dehydrated today.  CMP with mildly decreased sodium of 131.  CBC with mildly elevated white blood cell count of 11.0, hemoglobin 10, MCV of 70.9, MCH 21.7. Patient given migraine cocktail and pain is improved.  Given referral to establish care with primary care.  Told to follow-up with [facility] regarding further pregnancy care.  The patient may return to the emergency department for any new, worsening, or concerning symptoms. Patient was given the opportunity to ask questions; all questions were answered. Emergency department return precautions were discussed with the patient.  Patient is in agreement to the treatment plan.  Patient is stable for discharge.    FINAL CLINICAL IMPRESSION(S) / ED DIAGNOSES   Final diagnoses:  Positive pregnancy test  Migraine without aura and without status migrainosus, not intractable     Rx / DC Orders   ED Discharge Orders           Ordered    Ambulatory Referral to Primary Care (Establish Care)        10/20/24 2217             Note:  This document was prepared using Dragon voice recognition software and may include unintentional dictation errors.     Sheron Salm, PA-C 10/20/24 2302    Bradler, Evan K, MD 10/20/24 [phone]

## 2024-10-20 NOTE — Discharge Instructions (Addendum)
 You have been seen in the Emergency Department (ED) for a migraine.  Please use Tylenol  as needed for symptoms, but only as written on the box, and take any regular medications that have been prescribed for you. As we have discussed, please follow up with your doctor as soon as possible regarding today's ED visit and your headache symptoms.  If you do not have a primary care provider I have placed a referral to 1 for you.  Please look at the list of resources and give one of their offices a call at your earliest convenience.  He also had a positive pregnancy test today.  Please follow-up with [facility] as soon as possible.  Call your doctor or return to the Emergency Department (ED) if you have a worsening headache, sudden and severe headache, confusion, slurred speech, facial droop, weakness or numbness in any arm or leg, extreme fatigue, or other symptoms that concern you.

## 2024-10-20 NOTE — ED Triage Notes (Signed)
 Patient to ED via POV for migraine. Ongoing x3 days. States hx of same. Reports use to take medication for same but insurance stopped covering it.

## 2024-11-11 ENCOUNTER — Ambulatory Visit: Payer: Self-pay

## 2024-11-11 NOTE — Progress Notes (Signed)
 New OB Intake  I connected with  Vanessa Lindsey on 11/12/24 at  3:15 PM EST by MyChart Video Visit and verified that I am speaking with the correct person using two identifiers. Nurse is located at [facility] and pt is located at Home.  I discussed the limitations, risks, security and privacy concerns of performing an evaluation and management service by telephone and the availability of in person appointments. I also discussed with the patient that there may be a patient responsible charge related to this service. The patient expressed understanding and agreed to proceed.  I explained I am completing New OB Intake today. We discussed her EDD of 04/22/25 that is based on LMP of 07/16/24. Pt is G2/P2002. I reviewed her allergies, medications, Medical/Surgical/OB history, and appropriate screenings. There are cats in the home: no. Based on history, this is a/an pregnancy uncomplicated . Her obstetrical history is significant for obesity.  Patient Active Problem List   Diagnosis Date Noted   Supervision of other normal pregnancy, antepartum 11/12/2024   ASCUS with positive high risk HPV cervical 08/01/2022   History of premature rupture of membranes 10/13/2019   Smoker 09/09/2019   Obesity (BMI 35.0-39.9 without comorbidity) 09/05/2017    Concerns addressed today: No concerns today.  Delivery Plans:  Plans to deliver at [facility].  Anatomy US  Explained first scheduled US  will be 11/18/24. Anatomy US  will be scheduled around [redacted] weeks gestational age.  Labs Discussed genetic screening with patient. Patient consents genetic testing to be drawn at new OB visit. Discussed possible labs to be drawn at new OB appointment.  COVID Vaccine Patient has had COVID vaccine.   Social Determinants of Health Food Insecurity: expresses food insecurity. Information given on local food banks. WIC Referral: Patient is interested in referral to [facility].  Transportation: Patient denies  transportation needs. Childcare: Discussed no children allowed at ultrasound appointments.   First visit review I reviewed new OB appt with pt. I explained she will have blood work and pap smear/pelvic exam if indicated. Explained pt will be seen by Jinnie Cookey, CNM at first visit; encounter routed to appropriate provider.   Mathis LITTIE Getting, CMA 11/12/2024  4:37 PM

## 2024-11-11 NOTE — Patient Instructions (Signed)
 First Trimester of Pregnancy  The first trimester of pregnancy starts on the first day of your last monthly period until the end of week 13. This is months 1 through 3 of pregnancy. A week after a sperm fertilizes an egg, the egg will implant into the wall of the uterus and begin to develop into a baby. Body changes during your first trimester Your body goes through many changes during pregnancy. The changes usually return to normal after your baby is born. Physical changes Your breasts may grow larger and may hurt. The area around your nipples may get darker. Your periods will stop. Your hair and nails may grow faster. You may pee more often. Health changes You may tire easily. Your gums may bleed and may be sensitive when you brush and floss. You may not feel hungry. You may have heartburn. You may throw up or feel like you may throw up. You may want to eat some foods, but not others. You may have headaches. You may have trouble pooping (constipation). Other changes Your emotions may change from day to day. You may have more dreams. Follow these instructions at home: Medicines Talk to your health care provider if you're taking medicines. Ask if the medicines are safe to take during pregnancy. Your provider may change the medicines that you take. Do not take any medicines unless told to by your provider. Take a prenatal vitamin that has at least 600 micrograms (mcg) of folic acid. Do not use herbal medicines, illegal substances, or medicines that are not approved by your provider. Eating and drinking While you're pregnant your body needs extra food for your growing baby. Talk with your provider about what to eat while pregnant. Activity Most women are able to exercise during pregnancy. Exercises may need to change as your pregnancy goes on. Talk to your provider about your activities and exercise routines. Relieving pain and discomfort Wear a good, supportive bra if your breasts  hurt. Rest with your legs raised if you have leg cramps or low back pain. Safety Wear your seatbelt at all times when you're in a car. Talk to your provider if someone hits you, hurts you, or yells at you. Talk with your provider if you're feeling sad or have thoughts of hurting yourself. Lifestyle Certain things can be harmful while you're pregnant. Follow these rules: Do not use hot tubs, steam rooms, or saunas. Do not douche. Do not use tampons or scented pads. Do not drink alcohol,smoke, vape, or use products with nicotine or tobacco in them. If you need help quitting, talk with your provider. Avoid cat litter boxes and soil used by cats. These things carry germs that can cause harm to your pregnancy and your baby. General instructions Keep all follow-up visits. It helps you and your unborn baby stay as healthy as possible. Write down your questions. Take them to your visits. Your provider will: Talk with you about your overall health. Give you advice or refer you to specialists who can help with different needs, including: Prenatal education classes. Mental health and counseling. Foods and healthy eating. Ask for help if you need help with food. Call your dentist and ask to be seen. Brush your teeth with a soft toothbrush. Floss gently. Where to find more information American Pregnancy Association: americanpregnancy.org [employer]: acog.org Office on [employer] Health: [employer] Contact a health care provider if: You feel dizzy, faint, or have a fever. You vomit or have watery poop (diarrhea) for 2  days or more. You have abnormal discharge or bleeding from your vagina. You have pain when you pee or your pee smells bad. You have cramps, pain, or pressure in your belly area. Get help right away if: You have trouble breathing or chest pain. You have any kind of injury, such as from a fall or a car crash. These symptoms may be an  emergency. Get help right away. Call 911. Do not wait to see if the symptoms will go away. Do not drive yourself to the hospital. This information is not intended to replace advice given to you by your health care provider. Make sure you discuss any questions you have with your health care provider. Document Revised: 09/19/2023 Document Reviewed: 04/19/2023 Elsevier Patient Education  2024 [employer]   Common Medications Safe in Pregnancy  Acne:      Constipation:  Benzoyl Peroxide     Colace  Clindamycin      Dulcolax Suppository  Topica Erythromycin     Fibercon  Salicylic Acid      Metamucil         Miralax AVOID:        Senakot   Accutane    Cough:  Retin-A       Cough Drops  Tetracycline      Phenergan w/ Codeine if Rx  Minocycline      Robitussin (Plain & DM)  Antibiotics:     Crabs/Lice:  Ceclor       RID  Cephalosporins    AVOID:  E-Mycins      Kwell  Keflex  Macrobid/Macrodantin   Diarrhea:  Penicillin      Kao-Pectate  Zithromax      Imodium AD         PUSH FLUIDS AVOID:       Cipro     Fever:  Tetracycline      Tylenol (Regular or Extra  Minocycline       Strength)  Levaquin      Extra Strength-Do not          Exceed 8 tabs/24 hrs Caffeine:        200mg /day (equiv. To 1 cup of coffee or  approx. 3 12 oz sodas)         Gas: Cold/Hayfever:       Gas-X  Benadryl      Mylicon  Claritin       Phazyme  **Claritin-D        Chlor-Trimeton    Headaches:  Dimetapp      ASA-Free Excedrin  Drixoral-Non-Drowsy     Cold Compress  Mucinex (Guaifenasin)     Tylenol (Regular or Extra  Sudafed/Sudafed-12 Hour     Strength)  **Sudafed PE Pseudoephedrine   Tylenol Cold & Sinus     Vicks Vapor Rub  Zyrtec  **AVOID if Problems With Blood Pressure         Heartburn: Avoid lying down for at least 1 hour after meals  Aciphex      Maalox     Rash:  Milk of Magnesia     Benadryl    Mylanta       1% Hydrocortisone Cream  Pepcid  Pepcid Complete   Sleep  Aids:  Prevacid      Ambien   Prilosec       Benadryl  Rolaids       Chamomile Tea  Tums (Limit 4/day)     Unisom  Tylenol PM         Warm milk-add vanilla or  Hemorrhoids:       Sugar for taste  Anusol/Anusol H.C.  (RX: Analapram 2.5%)  Sugar Substitutes:  Hydrocortisone OTC     Ok in moderation  Preparation H      Tucks        Vaseline lotion applied to tissue with wiping    Herpes:     Throat:  Acyclovir      Oragel  Famvir  Valtrex     Vaccines:         Flu Shot Leg Cramps:       *Gardasil  Benadryl      Hepatitis A         Hepatitis B Nasal Spray:       Pneumovax  Saline Nasal Spray     Polio Booster         Tetanus Nausea:       Tuberculosis test or PPD  Vitamin B6 25 mg TID   AVOID:    Dramamine      *Gardasil  Emetrol       Live Poliovirus  Ginger Root 250 mg QID    MMR (measles, mumps &  High Complex Carbs @ Bedtime    rebella)  Sea Bands-Accupressure    Varicella (Chickenpox)  Unisom 1/2 tab TID     *No known complications           If received before Pain:         Known pregnancy;   Darvocet       Resume series after  Lortab        Delivery  Percocet    Yeast:   Tramadol      Femstat  Tylenol 3      Gyne-lotrimin  Ultram       Monistat  Vicodin           MISC:         All Sunscreens           Hair Coloring/highlights          Insect Repellant's          (Including DEET)         Mystic Tans   Commonly Asked Questions During Pregnancy   Cats: A parasite can be excreted in cat feces.  To avoid exposure you need to have another person empty the little box.  If you must empty the litter box you will need to wear gloves.  Wash your hands after handling your cat.  This parasite can also be found in raw or undercooked meat so this should also be avoided.  Colds, Sore Throats, Flu: Please check your medication sheet to see what you can take for symptoms.  If your symptoms are unrelieved by these medications please call the office.  Dental Work: Most  any dental work Agricultural consultant recommends is permitted.  X-rays should only be taken during the first trimester if absolutely necessary.  Your abdomen should be shielded with a lead apron during all x-rays.  Please notify your provider prior to receiving any x-rays.  Novocaine is fine; gas is not recommended.  If your dentist requires a note from [place] prior to dental work please call the office and we will provide one for you.  Exercise: Exercise is an important part of staying healthy during your pregnancy.  You may continue most exercises you were accustomed to prior to pregnancy.  Later in your pregnancy you will most likely notice you have difficulty with activities requiring balance like riding a bicycle.  It is important that you listen to your body and avoid activities that put you at a higher risk of falling.  Adequate rest and staying well hydrated are a must!  If you have questions about the safety of specific activities ask your provider.    Exposure to Children with illness: Try to avoid obvious exposure; report any symptoms to [place] when noted,  If you have chicken pos, red measles or mumps, you should be immune to these diseases.   Please do not take any vaccines while pregnant unless you have checked with your OB provider.  Fetal Movement: After 28 weeks we recommend you do "kick counts" twice daily.  Lie or sit down in a calm quiet environment and count your baby movements "kicks".  You should feel your baby at least 10 times per hour.  If you have not felt 10 kicks within the first hour get up, walk around and have something sweet to eat or drink then repeat for an additional hour.  If count remains less than 10 per hour notify your provider.  Fumigating: Follow your pest control agent's advice as to how long to stay out of your home.  Ventilate the area well before re-entering.  Hemorrhoids:   Most over-the-counter preparations can be used during pregnancy.  Check your medication to see what is  safe to use.  It is important to use a stool softener or fiber in your diet and to drink lots of liquids.  If hemorrhoids seem to be getting worse please call the office.   Hot Tubs:  Hot tubs Jacuzzis and saunas are not recommended while pregnant.  These increase your internal body temperature and should be avoided.  Intercourse:  Sexual intercourse is safe during pregnancy as long as you are comfortable, unless otherwise advised by your provider.  Spotting may occur after intercourse; report any bright red bleeding that is heavier than spotting.  Labor:  If you know that you are in labor, please go to the hospital.  If you are unsure, please call the office and let us help you decide what to do.  Lifting, straining, etc:  If your job requires heavy lifting or straining please check with your provider for any limitations.  Generally, you should not lift items heavier than that you can lift simply with your hands and arms (no back muscles)  Painting:  Paint fumes do not harm your pregnancy, but may make you ill and should be avoided if possible.  Latex or water based paints have less odor than oils.  Use adequate ventilation while painting.  Permanents & Hair Color:  Chemicals in hair dyes are not recommended as they cause increase hair dryness which can increase hair loss during pregnancy.  " Highlighting" and permanents are allowed.  Dye may be absorbed differently and permanents may not hold as well during pregnancy.  Sunbathing:  Use a sunscreen, as skin burns easily during pregnancy.  Drink plenty of fluids; avoid over heating.  Tanning Beds:  Because their possible side effects are still unknown, tanning beds are not recommended.  Ultrasound Scans:  Routine ultrasounds are performed at approximately 20 weeks.  You will be able to see your baby's general anatomy an if you would like to know the gender this can usually be determined as well.  If it is questionable when you conceived you may  also  receive an ultrasound early in your pregnancy for dating purposes.  Otherwise ultrasound exams are not routinely performed unless there is a medical necessity.  Although you can request a scan we ask that you pay for it when conducted because insurance does not cover " patient request" scans.  Work: If your pregnancy proceeds without complications you may work until your due date, unless your physician or employer advises otherwise.  Round Ligament Pain/Pelvic Discomfort:  Sharp, shooting pains not associated with bleeding are fairly common, usually occurring in the second trimester of pregnancy.  They tend to be worse when standing up or when you remain standing for long periods of time.  These are the result of pressure of certain pelvic ligaments called "round ligaments".  Rest, Tylenol and heat seem to be the most effective relief.  As the womb and fetus grow, they rise out of the pelvis and the discomfort improves.  Please notify the office if your pain seems different than that described.  It may represent a more serious condition.

## 2024-11-12 ENCOUNTER — Telehealth (INDEPENDENT_AMBULATORY_CARE_PROVIDER_SITE_OTHER): Payer: Self-pay

## 2024-11-12 DIAGNOSIS — Z3480 Encounter for supervision of other normal pregnancy, unspecified trimester: Secondary | ICD-10-CM

## 2024-11-18 ENCOUNTER — Other Ambulatory Visit: Payer: Self-pay

## 2024-11-18 DIAGNOSIS — Z3482 Encounter for supervision of other normal pregnancy, second trimester: Secondary | ICD-10-CM | POA: Diagnosis not present

## 2024-11-18 DIAGNOSIS — Z3480 Encounter for supervision of other normal pregnancy, unspecified trimester: Secondary | ICD-10-CM

## 2024-11-18 DIAGNOSIS — Z3A17 17 weeks gestation of pregnancy: Secondary | ICD-10-CM

## 2024-11-23 ENCOUNTER — Ambulatory Visit (INDEPENDENT_AMBULATORY_CARE_PROVIDER_SITE_OTHER): Payer: Self-pay | Admitting: Licensed Practical Nurse

## 2024-11-23 ENCOUNTER — Encounter: Payer: Self-pay | Admitting: Licensed Practical Nurse

## 2024-11-23 ENCOUNTER — Other Ambulatory Visit (HOSPITAL_COMMUNITY)
Admission: RE | Admit: 2024-11-23 | Discharge: 2024-11-23 | Disposition: A | Discharge status: Home / Self Care | Source: Ambulatory Visit | Attending: Licensed Practical Nurse | Admitting: Licensed Practical Nurse

## 2024-11-23 VITALS — BP 115/76 | HR 96 | Wt 192.9 lb

## 2024-11-23 DIAGNOSIS — O99012 Anemia complicating pregnancy, second trimester: Secondary | ICD-10-CM | POA: Insufficient documentation

## 2024-11-23 DIAGNOSIS — Z3A18 18 weeks gestation of pregnancy: Secondary | ICD-10-CM | POA: Insufficient documentation

## 2024-11-23 DIAGNOSIS — Z113 Encounter for screening for infections with a predominantly sexual mode of transmission: Secondary | ICD-10-CM

## 2024-11-23 DIAGNOSIS — D509 Iron deficiency anemia, unspecified: Secondary | ICD-10-CM

## 2024-11-23 DIAGNOSIS — Z3492 Encounter for supervision of normal pregnancy, unspecified, second trimester: Secondary | ICD-10-CM

## 2024-11-23 DIAGNOSIS — Z0283 Encounter for blood-alcohol and blood-drug test: Secondary | ICD-10-CM | POA: Diagnosis not present

## 2024-11-23 DIAGNOSIS — Z3480 Encounter for supervision of other normal pregnancy, unspecified trimester: Secondary | ICD-10-CM

## 2024-11-23 DIAGNOSIS — Z1379 Encounter for other screening for genetic and chromosomal anomalies: Secondary | ICD-10-CM

## 2024-11-23 MED ORDER — VITAFOL GUMMIES 3.33-0.333-34.8 MG PO CHEW: 3.0000 | CHEWABLE_TABLET | Freq: Every day | ORAL | 3 refills | Status: AC

## 2024-11-23 MED ORDER — ASPIRIN 81 MG PO CHEW: 81.0000 mg | CHEWABLE_TABLET | Freq: Every day | ORAL | 9 refills | Status: AC

## 2024-11-23 MED ORDER — ACCRUFER 30 MG PO CAPS: 1.0000 | ORAL_CAPSULE | Freq: Every day | ORAL | 9 refills | Status: AC

## 2024-11-23 NOTE — Progress Notes (Signed)
 NEW OB HISTORY AND PHYSICAL  SUBJECTIVE:       Vanessa Lindsey is a 28 y.o. [id number] female, Patient's last menstrual period was 07/16/2024 (approximate)., Estimated Date of Delivery: 04/22/25, [redacted]w[redacted]d, presents today for establishment of Prenatal Care. This pregnancy was not planned, she is happy to be pregnant Is considering sterilization, but would like to know how reversible it is  She reports was nauseous at first, did not know she was pregnant for a while, was sick with allergies and a virus so assumes the nausea from that., her boyfriend's mom suggested she was pregnant and she was    Was seen at [facility], was told her iron I slow, need script for Iron   HX asthma: has inhaler, last used a couple of months ago, no hosptial stays  G3, not sure how much she gained, breast fed 6-9 months, denies PPD, had blues with first  ASC-US  HPV pos x2, has not had colpo out of fear    Social history Partner/Relationship:partnered, [place]  Living situation: lives with her mom and children, Aloysius lives with his mom or [place], he is father of her children  Work:Waitress  Exercise: walking  Substance ldz:cjez, no alcohol or ilict drugs   Indications for ASA therapy (per uptodate) One of the following: Previous pregnancy with preeclampsia, especially early onset and with an adverse outcome No Multifetal gestation No Chronic hypertension No Type 1 or 2 diabetes mellitus No Chronic kidney disease No Autoimmune disease (antiphospholipid syndrome, systemic lupus erythematosus) No  Two or more of the following: Nulliparity No Obesity (body mass index >30 kg/m2) Yes Family history of preeclampsia in mother or sister No Age >=35 years No Sociodemographic characteristics (African American race, low socioeconomic level) No Personal risk factors (eg, previous pregnancy with low birth weight or small for gestational age infant, previous adverse pregnancy outcome [eg, stillbirth], interval >10 years between  pregnancies) No  Indications for early GDM screening  First-degree relative with diabetes Yes BMI >30kg/m2 Yes Age > 35 No Previous birth of an infant weighing >=4000 g No Gestational diabetes mellitus in a previous pregnancy No Glycated hemoglobin >=5.7 percent (39 mmol/mol), impaired glucose tolerance, or impaired fasting glucose on previous testing No High-risk race/ethnicity (eg, African American, Latino, Native American, Asian American, Pacific Islander) No Previous stillbirth of unknown cause No Maternal birthweight > 9 lbs No History of cardiovascular disease No Hypertension or on therapy for hypertension No High-density lipoprotein cholesterol level <35 mg/dL (9.09 mmol/L) and/or a triglyceride level >250 mg/dL (7.17 mmol/L) No Polycystic ovary syndrome No Physical inactivity No Other clinical condition associated with insulin resistance (eg, severe obesity, acanthosis nigricans) No Current use of glucocorticoids No   Early screening tests: FBS, A1C, Random CBG, glucose challenge  Gynecologic History Patient's last menstrual period was 07/16/2024 (approximate). Irregular cycles  Contraception: none Last Pap:     Component Value Date/Time   DIAGPAP (A) 02/11/2023 1634    - Atypical squamous cells of undetermined significance (ASC-US )   DIAGPAP (A) 07/25/2022 1223    - Atypical squamous cells of undetermined significance (ASC-US )   DIAGPAP  04/27/2019 0000    NEGATIVE FOR INTRAEPITHELIAL LESIONS OR MALIGNANCY.   HPVHIGH Positive (A) 02/11/2023 1634   HPVHIGH Positive (A) 07/25/2022 1223   ADEQPAP  02/11/2023 1634    Satisfactory for evaluation; transformation zone component PRESENT.   ADEQPAP  07/25/2022 1223    Satisfactory for evaluation; transformation zone component PRESENT.   ADEQPAP  04/27/2019 0000    Satisfactory for evaluation  endocervical/transformation zone component PRESENT.  SABRA Results were: abnormal  Obstetric History OB History  Gravida Para Term  Preterm AB Living  3 2 2  0 0 2  SAB IAB Ectopic Multiple Live Births  0 0 0 0 2    # Outcome Date GA Lbr Len/2nd Weight Sex Type Anes PTL Lv  3 Current           2 Term 12/19/22 [redacted]w[redacted]d / 00:09 6 lb 5.9 oz (2.89 kg) F Vag-Spont EPI  LIV  1 Term 10/13/19 [redacted]w[redacted]d  6 lb 3 oz (2.807 kg) F Vag-Spont None N LIV    Past Medical History:  Diagnosis Date   Allergy    Asthma    Chickenpox    Migraine     Past Surgical History:  Procedure Laterality Date   NO PAST SURGERIES      Current Outpatient Medications on File Prior to Visit  Medication Sig Dispense Refill   Iron-FA-B Cmp-C-Biot-Probiotic (FUSION PLUS) CAPS Take by mouth.     albuterol  (VENTOLIN  HFA) 108 (90 Base) MCG/ACT inhaler Inhale 2 puffs into the lungs every 6 (six) hours as needed. 18 g 2   magnesium  gluconate (MAGONATE) 500 (27 Mg) MG TABS tablet Take 300 mg by mouth once.     norethindrone  (ORTHO MICRONOR ) 0.35 MG tablet Take 1 tablet (0.35 mg total) by mouth daily. (Patient not taking: Reported on 11/12/2024) 28 tablet 11   No current facility-administered medications on file prior to visit.    Allergies  Allergen Reactions   Sulfa Antibiotics Hives    Social History   Socioeconomic History   Marital status: Single    Spouse name: Not on file   Number of children: 2   Years of education: Not on file   Highest education level: Not on file  Occupational History   Not on file  Tobacco Use   Smoking status: Some Days    Current packs/day: 0.25    Types: Cigarettes   Smokeless tobacco: Never   Tobacco comments:    Declines cessation materials or support at this time  Vaping Use   Vaping status: Some Days   Substances: Nicotine   Substance and Sexual Activity   Alcohol use: Not Currently   Drug use: No   Sexual activity: Not Currently    Birth control/protection: None  Other Topics Concern   Not on file  Social History Narrative   Single.   No children.   Works at [employer].   Enjoys shopping,  spending time with friends.   Social Drivers of Health   Financial Resource Strain: Medium Risk (05/21/2022)   Overall Financial Resource Strain (CARDIA)    Difficulty of Paying Living Expenses: Somewhat hard  Food Insecurity: Food Insecurity Present (11/12/2024)   Hunger Vital Sign    Worried About Running Out of Food in the Last Year: Sometimes true    Ran Out of Food in the Last Year: Sometimes true  Transportation Needs: No Transportation Needs (11/12/2024)   PRAPARE - Administrator, Civil Service (Medical): No    Lack of Transportation (Non-Medical): No  Physical Activity: Insufficiently Active (11/12/2024)   Exercise Vital Sign    Days of Exercise per Week: 1 day    Minutes of Exercise per Session: 60 min  Stress: Stress Concern Present (11/12/2024)   [employer] of Occupational Health - Occupational Stress Questionnaire    Feeling of Stress: To some extent  Social Connections: Moderately Integrated (05/21/2022)   Social  Connection and Isolation Panel    Frequency of Communication with Friends and Family: More than three times a week    Frequency of Social Gatherings with Friends and Family: Once a week    Attends Religious Services: 1 to 4 times per year    Active Member of [employer] or Organizations: No    Attends Banker Meetings: Never    Marital Status: Living with partner  Intimate Partner Violence: Not At Risk (12/18/2022)   Humiliation, Afraid, Rape, and Kick questionnaire    Fear of Current or Ex-Partner: No    Emotionally Abused: No    Physically Abused: No    Sexually Abused: No    Family History  Problem Relation Age of Onset   Hypothyroidism Mother    Heart attack Father    Diabetes Father    Hyperlipidemia Father    Uterine cancer Maternal Grandmother    Breast cancer Paternal Grandmother    Ovarian cancer Neg Hx    Colon cancer Neg Hx     The following portions of the patient's history were reviewed and updated as  appropriate: allergies, current medications, past OB history, past medical history, past surgical history, past family history, past social history, and problem list.  Constitutional: Denied constitutional symptoms, night sweats, recent illness, fatigue, fever, insomnia and weight loss.  Eyes: Denied eye symptoms, eye pain, photophobia, vision change and visual disturbance.  Ears/Nose/Throat/Neck: Denied ear, nose, throat or neck symptoms, hearing loss, nasal discharge, sinus congestion and sore throat.  Cardiovascular: Denied cardiovascular symptoms, arrhythmia, chest pain/pressure, edema, exercise intolerance, orthopnea and palpitations.  Respiratory: Denied pulmonary symptoms, asthma, pleuritic pain, productive sputum, cough, dyspnea and wheezing.  Gastrointestinal: Denied gastro-esophageal reflux, melena, nausea and vomiting.  Genitourinary: Denied genitourinary symptoms including symptomatic vaginal discharge, pelvic relaxation issues, and urinary complaints.  Musculoskeletal: Denied musculoskeletal symptoms, stiffness, swelling, muscle weakness and myalgia.  Dermatologic: Denied dermatology symptoms, rash and scar.  Neurologic: Denied neurology symptoms, dizziness, headache, neck pain and syncope.  Psychiatric: Denied psychiatric symptoms, anxiety and depression.  Endocrine: Denied endocrine symptoms including hot flashes and night sweats.     OBJECTIVE: Initial Physical Exam (New OB) BP 115/76   Pulse 96   Wt 192 lb 14.4 oz (87.5 kg)   LMP 07/16/2024 (Approximate)   BMI 36.45 kg/m  Physical Exam Constitutional:      Appearance: Normal appearance. She is obese.  Cardiovascular:     Rate and Rhythm: Normal rate and regular rhythm.     Pulses: Normal pulses.     Heart sounds: Normal heart sounds.  Pulmonary:     Effort: Pulmonary effort is normal.     Breath sounds: Normal breath sounds.  Chest:     Comments: Breasts: soft, no redness or masses, nipples intact bilaterally   Abdominal:     Tenderness: There is no abdominal tenderness.     Comments: Fetal heart tone present   Musculoskeletal:        General: Normal range of motion.     Cervical back: Normal range of motion.  Skin:    General: Skin is warm.  Neurological:     General: No focal deficit present.     Mental Status: She is alert.  Psychiatric:        Mood and Affect: Mood normal.        Thought Content: Thought content normal.     Fetal Heart Rate (bpm): 160  ASSESSMENT: Normal pregnancy   PLAN: Routine prenatal care. We discussed  an overview of prenatal care and when to call. Reviewed diet, exercise, and weight gain recommendations in pregnancy. Discussed benefits of breastfeeding and lactation resources at [facility]. I reviewed labs and answered all questions.  1. Supervision of other normal pregnancy, antepartum - NOB Panel - Culture, OB Urine - Monitor Drug Profile 14(MW) - Nicotine  screen, urine - Urinalysis, Routine w reflex microscopic - Comprehensive metabolic panel - Hgb Fractionation Cascade - TSH + free T4 - Cervicovaginal ancillary only - MaterniT21 PLUS Core - aspirin  81 MG chewable tablet; Chew 1 tablet (81 mg total) by mouth daily.  Dispense: 30 tablet; Refill: 9  2. [redacted] weeks gestation of pregnancy - NOB Panel - Culture, OB Urine - Monitor Drug Profile 14(MW) - Nicotine  screen, urine - Urinalysis, Routine w reflex microscopic - Comprehensive metabolic panel - Hgb Fractionation Cascade - TSH + free T4 - Cervicovaginal ancillary only - Prenatal Vit-Fe Phos-FA-Omega (VITAFOL  GUMMIES) 3.33-0.333-34.8 MG CHEW; Chew 3 tablets by mouth daily.  Dispense: 270 tablet; Refill: 3 - MaterniT21 PLUS Core  3. Genetic screening - MaterniT21 PLUS Core  4. Prenatal care in second trimester, unspecified gravidity - Prenatal Vit-Fe Phos-FA-Omega (VITAFOL  GUMMIES) 3.33-0.333-34.8 MG CHEW; Chew 3 tablets by mouth daily.  Dispense: 270 tablet; Refill: 3  5. Encounter for drug  screening - Monitor Drug Profile 14(MW)  6. Screening examination for STD (sexually transmitted disease) - Cervicovaginal ancillary only  7. Maternal iron deficiency anemia affecting pregnancy in second trimester, antepartum (Primary) - Ferric Maltol  (ACCRUFER ) 30 MG CAPS; Take 1 capsule (30 mg total) by mouth daily.  Dispense: 30 capsule; Refill: 9  Anatomy US  12/17/24 Discussed daily ASA Rec TWG 11-20lbs    Harrison Paulson M Tannia Contino, CNM

## 2024-11-24 ENCOUNTER — Encounter: Payer: Self-pay | Admitting: Licensed Practical Nurse

## 2024-11-24 ENCOUNTER — Ambulatory Visit (INDEPENDENT_AMBULATORY_CARE_PROVIDER_SITE_OTHER): Admitting: Physician Assistant

## 2024-11-24 ENCOUNTER — Encounter: Payer: Self-pay | Admitting: Physician Assistant

## 2024-11-24 VITALS — BP 109/75 | HR 102 | Resp 16 | Ht 61.0 in | Wt 193.7 lb

## 2024-11-24 DIAGNOSIS — R7303 Prediabetes: Secondary | ICD-10-CM | POA: Insufficient documentation

## 2024-11-24 DIAGNOSIS — K5909 Other constipation: Secondary | ICD-10-CM | POA: Insufficient documentation

## 2024-11-24 DIAGNOSIS — G4489 Other headache syndrome: Secondary | ICD-10-CM | POA: Insufficient documentation

## 2024-11-24 DIAGNOSIS — J302 Other seasonal allergic rhinitis: Secondary | ICD-10-CM | POA: Insufficient documentation

## 2024-11-24 DIAGNOSIS — Z7689 Persons encountering health services in other specified circumstances: Secondary | ICD-10-CM

## 2024-11-24 DIAGNOSIS — F17200 Nicotine dependence, unspecified, uncomplicated: Secondary | ICD-10-CM | POA: Insufficient documentation

## 2024-11-24 DIAGNOSIS — J45909 Unspecified asthma, uncomplicated: Secondary | ICD-10-CM | POA: Diagnosis not present

## 2024-11-24 DIAGNOSIS — Z3A18 18 weeks gestation of pregnancy: Secondary | ICD-10-CM | POA: Insufficient documentation

## 2024-11-24 LAB — URINALYSIS, ROUTINE W REFLEX MICROSCOPIC
Bilirubin, UA: NEGATIVE
Glucose, UA: NEGATIVE
Ketones, UA: NEGATIVE
Nitrite, UA: NEGATIVE
RBC, UA: NEGATIVE
Specific Gravity, UA: 1.022 (ref 1.005–1.030)
Urobilinogen, Ur: 1 mg/dL (ref 0.2–1.0)
pH, UA: 7.5 (ref 5.0–7.5)

## 2024-11-24 LAB — MICROSCOPIC EXAMINATION
Bacteria, UA: NONE SEEN
Casts: NONE SEEN /LPF
RBC, Urine: NONE SEEN /HPF (ref 0–2)
WBC, UA: NONE SEEN /HPF (ref 0–5)

## 2024-11-24 LAB — CERVICOVAGINAL ANCILLARY ONLY
Bacterial Vaginitis (gardnerella): NEGATIVE
Candida Glabrata: POSITIVE — AB
Candida Vaginitis: NEGATIVE
Chlamydia: NEGATIVE
Comment: NEGATIVE
Comment: NEGATIVE
Comment: NEGATIVE
Comment: NEGATIVE
Comment: NEGATIVE
Comment: NORMAL
Neisseria Gonorrhea: NEGATIVE
Trichomonas: NEGATIVE

## 2024-11-24 NOTE — Progress Notes (Signed)
 New patient visit  Patient: Vanessa Lindsey   DOB: 10/19/1996   28 y.o. Female  MRN: [id number] Visit Date: 11/24/2024  Today's healthcare provider: Jolynn Spencer, PA-C   Chief Complaint  Patient presents with   New Patient (Initial Visit)    Establish care   Headache   Asthma   Subjective    Vanessa Lindsey is a 28 y.o. female who presents today as a new patient to establish care.   Discussed the use of AI scribe software for clinical note transcription with the patient, who gave verbal consent to proceed.  History of Present Illness Vanessa Lindsey is a 28 year old female who presents with headache and seasonal allergies. She is accompanied by her daughter.  She experiences migraines about five times a month, lasting one to two days, starting mild and often progressing to severe pain with dizziness and nausea. Tylenol  helps inconsistently. She stopped Emgality because of cost and is not on a specific preventive or abortive migraine medication now.  Her seasonal allergies recently worsened, with an episode that caused severe headache, poor oral intake, and dehydration, keeping her in bed for three days. Movement worsened the headache. She is not taking daily allergy medication.  She has asthma with flares four to five times a year. Currently she has congestion and mild breathing difficulty but no frank shortness of breath. She uses albuterol  and has another inhaler at home that is expired and she is avoiding it during pregnancy.  She is currently pregnant. In a prior pregnancy she was considered high risk due to weight and was placed on aspirin , without preeclampsia or gestational hypertension. She notes eye floaters and occasional constipation, which she associates with this pregnancy.  Both parents have diabetes. In prior pregnancies her initial glucose tests were high, but follow-up monitoring showed blood sugars generally below 125 mg/dL.    Past Medical History:   Diagnosis Date   Allergy    Asthma    Chickenpox    Migraine    Past Surgical History:  Procedure Laterality Date   NO PAST SURGERIES     Family Status  Relation Name Status   Mother  (Not Specified)   Father  Deceased   MGM  (Not Specified)   PGM  (Not Specified)   Neg Hx  (Not Specified)  No partnership data on file   Family History  Problem Relation Age of Onset   Hypothyroidism Mother    Heart attack Father    Diabetes Father    Hyperlipidemia Father    Uterine cancer Maternal Grandmother    Breast cancer Paternal Grandmother    Ovarian cancer Neg Hx    Colon cancer Neg Hx    Social History   Socioeconomic History   Marital status: Single    Spouse name: Not on file   Number of children: 2   Years of education: Not on file   Highest education level: Not on file  Occupational History   Not on file  Tobacco Use   Smoking status: Some Days    Current packs/day: 0.25    Types: Cigarettes   Smokeless tobacco: Never   Tobacco comments:    Declines cessation materials or support at this time  Vaping Use   Vaping status: Some Days   Substances: Nicotine   Substance and Sexual Activity   Alcohol use: Not Currently   Drug use: No   Sexual activity: Not Currently    Birth control/protection: None  Other Topics  Concern   Not on file  Social History Narrative   Single.   No children.   Works at [employer].   Enjoys shopping, spending time with friends.   Social Drivers of Health   Financial Resource Strain: Medium Risk (05/21/2022)   Overall Financial Resource Strain (CARDIA)    Difficulty of Paying Living Expenses: Somewhat hard  Food Insecurity: Food Insecurity Present (11/12/2024)   Hunger Vital Sign    Worried About Running Out of Food in the Last Year: Sometimes true    Ran Out of Food in the Last Year: Sometimes true  Transportation Needs: No Transportation Needs (11/12/2024)   PRAPARE - Administrator, Civil Service (Medical): No     Lack of Transportation (Non-Medical): No  Physical Activity: Insufficiently Active (11/12/2024)   Exercise Vital Sign    Days of Exercise per Week: 1 day    Minutes of Exercise per Session: 60 min  Stress: Stress Concern Present (11/12/2024)   [employer] of Occupational Health - Occupational Stress Questionnaire    Feeling of Stress: To some extent  Social Connections: Moderately Integrated (05/21/2022)   Social Connection and Isolation Panel    Frequency of Communication with Friends and Family: More than three times a week    Frequency of Social Gatherings with Friends and Family: Once a week    Attends Religious Services: 1 to 4 times per year    Active Member of [employer] or Organizations: No    Attends Banker Meetings: Never    Marital Status: Living with partner   Outpatient Medications Prior to Visit  Medication Sig   aspirin  81 MG chewable tablet Chew 1 tablet (81 mg total) by mouth daily.   Ferric Maltol  (ACCRUFER ) 30 MG CAPS Take 1 capsule (30 mg total) by mouth daily.   magnesium  gluconate (MAGONATE) 500 (27 Mg) MG TABS tablet Take 300 mg by mouth once.   Prenatal Vit-Fe Phos-FA-Omega (VITAFOL  GUMMIES) 3.33-0.333-34.8 MG CHEW Chew 3 tablets by mouth daily.   [DISCONTINUED] albuterol  (VENTOLIN  HFA) 108 (90 Base) MCG/ACT inhaler Inhale 2 puffs into the lungs every 6 (six) hours as needed.   [DISCONTINUED] Iron-FA-B Cmp-C-Biot-Probiotic (FUSION PLUS) CAPS Take by mouth.   [DISCONTINUED] norethindrone  (ORTHO MICRONOR ) 0.35 MG tablet Take 1 tablet (0.35 mg total) by mouth daily.   No facility-administered medications prior to visit.   Allergies  Allergen Reactions   Sulfa Antibiotics Hives    Immunization History  Administered Date(s) Administered   DTP 03/29/1994, 08/01/1994   DTP / HiB 05/29/1994, 04/25/1995   Hep B, Unspecified 1994/04/03, 02/26/1994, 08/01/1994   IPV 05/29/1994, 08/01/1994   MMR 04/25/1995   OPV 03/29/1994   PFIZER(Purple  Top)SARS-COV-2 Vaccination 07/05/2020, 07/26/2020   Tdap 09/11/2006, 07/30/2019, 09/18/2022    Health Maintenance  Topic Date Due   Pneumococcal Vaccine (1 of 2 - PCV) Never done   HPV VACCINES (1 - 3-dose SCDM series) Never done   Influenza Vaccine  Never done   COVID-19 Vaccine (3 - 2025-26 season) 08/31/2024   Cervical Cancer Screening (HPV/Pap Cotest)  02/11/2026   DTaP/Tdap/Td (8 - Td or Tdap) 09/18/2032   Hepatitis C Screening  Completed   HIV Screening  Completed   Meningococcal B Vaccine  Aged Out    Patient Care Team: Hence Derrick, PA-C as PCP - General (Physician Assistant)  Review of Systems  All other systems reviewed and are negative.  Except see HPI       Objective  BP 109/75   Pulse (!) 102   Resp 16   Ht 5' 1 (1.549 m)   Wt 193 lb 11.2 oz (87.9 kg)   LMP 07/16/2024 (Approximate)   SpO2 98%   BMI 36.60 kg/m     Physical Exam Vitals reviewed.  Constitutional:      General: She is not in acute distress.    Appearance: Normal appearance. She is well-developed. She is obese. She is not diaphoretic.  HENT:     Head: Normocephalic and atraumatic.  Eyes:     General: No scleral icterus.    Conjunctiva/sclera: Conjunctivae normal.  Neck:     Thyroid : No thyromegaly.  Cardiovascular:     Rate and Rhythm: Normal rate and regular rhythm.     Pulses: Normal pulses.     Heart sounds: Normal heart sounds. No murmur heard. Pulmonary:     Effort: Pulmonary effort is normal. No respiratory distress.     Breath sounds: Normal breath sounds. No wheezing, rhonchi or rales.  Musculoskeletal:     Cervical back: Neck supple.     Right lower leg: No edema.     Left lower leg: No edema.  Lymphadenopathy:     Cervical: No cervical adenopathy.  Skin:    General: Skin is warm and dry.     Findings: No rash.  Neurological:     Mental Status: She is alert and oriented to person, place, and time. Mental status is at baseline.  Psychiatric:        Mood and  Affect: Mood normal.        Behavior: Behavior normal.     Depression Screen    11/12/2024    4:09 PM 11/19/2019    2:42 PM 10/29/2019   11:11 AM  PHQ 2/9 Scores  PHQ - 2 Score 1 0 1  PHQ- 9 Score  2  3      Data saved with a previous flowsheet row definition   No results found for any visits on 11/24/24.  Assessment & Plan      Assessment & Plan Pregnancy, [redacted] weeks gestation [redacted] weeks pregnant, no complications. Previous high-risk pregnancy managed with aspirin  and iron. Currently she is doing well and takes aspirin  81mg , iron supplements and prenatl vitamines Managed by obgyn  Migraine Chronic Migraines occur five times monthly, managed with over-the-counter medications. Referral to neurology was placed.  Asthma Chronic Asthma symptoms occur four to five times yearly, managed with albuterol  inhaler. Referral to allergy was placed  Seasonal allergic rhinitis Seasonal allergies present. - Avoidance measures discussed. - Use nasal saline rinses before nose sprays such as with Neilmed Sinus Rinse bottle.  Use distilled water.   Referral to allergy was placed  Prediabetes Prediabetes with family history of diabetes. Previous elevated glucose levels during pregnancy. - Ordered A1c test. - Advised on lifestyle modifications. Will follow-up  Vaping/Nicotine  dependence Advised on health risks of vaping. - Advised to avoid vaping.  Constipation complicating pregnancy Constipation during pregnancy, advised dietary changes. - Increase yogurt intake, high fiber intake Will follow-up   Encounter to establish care Welcomed to our clinic Reviewed past medical hx, social hx, family hx and surgical hx Pt advised to send all vaccination records or screening   No follow-ups on file.    The patient was advised to call back or seek an in-person evaluation if the symptoms worsen or if the condition fails to improve as anticipated.  I discussed the assessment and  treatment plan with the  patient. The patient was provided an opportunity to ask questions and all were answered. The patient agreed with the plan and demonstrated an understanding of the instructions.  I, Aliesha Dolata, PA-C have reviewed all documentation for this visit. The documentation on  11/24/2024   for the exam, diagnosis, procedures, and orders are all accurate and complete.  Jolynn Spencer, [facility], MMS [facility] [phone] (phone) [phone] (fax)  [facility]

## 2024-11-25 ENCOUNTER — Ambulatory Visit: Payer: Self-pay | Admitting: Physician Assistant

## 2024-11-25 LAB — LIPID PANEL
Chol/HDL Ratio: 3.1 ratio (ref 0.0–4.4)
Cholesterol, Total: 151 mg/dL (ref 100–199)
HDL: 49 mg/dL (ref >39)
LDL Chol Calc (NIH): 72 mg/dL (ref 0–99)
Triglycerides: 182 mg/dL — ABNORMAL HIGH (ref 0–149)
VLDL Cholesterol Cal: 30 mg/dL (ref 5–40)

## 2024-11-25 LAB — CBC/D/PLT+RPR+RH+ABO+RUBIGG...
Antibody Screen: NEGATIVE
Basophils Absolute: 0.1 x10E3/uL (ref 0.0–0.2)
Basos: 1 %
EOS (ABSOLUTE): 0.1 x10E3/uL (ref 0.0–0.4)
Eos: 1 %
HCV Ab: NONREACTIVE
HIV Screen 4th Generation wRfx: NONREACTIVE
Hematocrit: 31.6 % — ABNORMAL LOW (ref 34.0–46.6)
Hemoglobin: 9.4 g/dL — ABNORMAL LOW (ref 11.1–15.9)
Hepatitis B Surface Ag: NEGATIVE
Immature Grans (Abs): 0 x10E3/uL (ref 0.0–0.1)
Immature Granulocytes: 0 %
Lymphocytes Absolute: 1.9 x10E3/uL (ref 0.7–3.1)
Lymphs: 17 %
MCH: 21.4 pg — ABNORMAL LOW (ref 26.6–33.0)
MCHC: 29.7 g/dL — ABNORMAL LOW (ref 31.5–35.7)
MCV: 72 fL — ABNORMAL LOW (ref 79–97)
Monocytes Absolute: 0.5 x10E3/uL (ref 0.1–0.9)
Monocytes: 5 %
Neutrophils Absolute: 8.5 x10E3/uL — ABNORMAL HIGH (ref 1.4–7.0)
Neutrophils: 76 %
Platelets: 359 x10E3/uL (ref 150–450)
RBC: 4.39 x10E6/uL (ref 3.77–5.28)
RDW: 16.7 % — ABNORMAL HIGH (ref 11.7–15.4)
RPR Ser Ql: NONREACTIVE
Rh Factor: POSITIVE
Rubella Antibodies, IGG: 0.91 {index} — ABNORMAL LOW (ref >0.99)
Varicella zoster IgG: REACTIVE
WBC: 11.1 x10E3/uL — ABNORMAL HIGH (ref 3.4–10.8)

## 2024-11-25 LAB — COMPREHENSIVE METABOLIC PANEL WITH GFR
ALT: 8 IU/L (ref 0–32)
AST: 10 IU/L (ref 0–40)
Albumin: 3.5 g/dL — ABNORMAL LOW (ref 4.0–5.0)
Alkaline Phosphatase: 135 IU/L — ABNORMAL HIGH (ref 41–116)
BUN/Creatinine Ratio: 7 — ABNORMAL LOW (ref 9–23)
BUN: 4 mg/dL — ABNORMAL LOW (ref 6–20)
Bilirubin Total: 0.5 mg/dL (ref 0.0–1.2)
CO2: 18 mmol/L — ABNORMAL LOW (ref 20–29)
Calcium: 8.4 mg/dL — ABNORMAL LOW (ref 8.7–10.2)
Chloride: 104 mmol/L (ref 96–106)
Creatinine, Ser: 0.58 mg/dL (ref 0.57–1.00)
Globulin, Total: 2.7 g/dL (ref 1.5–4.5)
Glucose: 86 mg/dL (ref 70–99)
Potassium: 4.3 mmol/L (ref 3.5–5.2)
Sodium: 135 mmol/L (ref 134–144)
Total Protein: 6.2 g/dL (ref 6.0–8.5)
eGFR: 125 mL/min/1.73 (ref >59)

## 2024-11-25 LAB — URINE CULTURE, OB REFLEX

## 2024-11-25 LAB — TSH+FREE T4
Free T4: 0.92 ng/dL (ref 0.82–1.77)
TSH: 2.24 u[IU]/mL (ref 0.450–4.500)

## 2024-11-25 LAB — HGB FRACTIONATION CASCADE
Hgb A2: 1.9 % (ref 1.8–3.2)
Hgb A: 98.1 % (ref 96.4–98.8)
Hgb F: 0 % (ref 0.0–2.0)
Hgb S: 0 %

## 2024-11-25 LAB — HCV INTERPRETATION

## 2024-11-25 LAB — CULTURE, OB URINE

## 2024-11-25 LAB — HEMOGLOBIN A1C
Est. average glucose Bld gHb Est-mCnc: 111 mg/dL
Hgb A1c MFr Bld: 5.5 % (ref 4.8–5.6)

## 2024-11-27 ENCOUNTER — Encounter: Payer: Self-pay | Admitting: Licensed Practical Nurse

## 2024-11-27 LAB — MATERNIT 21 PLUS CORE, BLOOD
Fetal Fraction: 8
Result (T21): NEGATIVE
Trisomy 13 (Patau syndrome): NEGATIVE
Trisomy 18 (Edwards syndrome): NEGATIVE
Trisomy 21 (Down syndrome): NEGATIVE

## 2024-11-28 LAB — MONITOR DRUG PROFILE 14(MW)
Amphetamine Scrn, Ur: NEGATIVE ng/mL
BARBITURATE SCREEN URINE: NEGATIVE ng/mL
BENZODIAZEPINE SCREEN, URINE: NEGATIVE ng/mL
Buprenorphine, Urine: NEGATIVE ng/mL
Cocaine (Metab) Scrn, Ur: NEGATIVE ng/mL
Creatinine(Crt), U: 179.2 mg/dL (ref 20.0–300.0)
Fentanyl, Urine: NEGATIVE pg/mL
Meperidine Screen, Urine: NEGATIVE ng/mL
Methadone Screen, Urine: NEGATIVE ng/mL
OXYCODONE+OXYMORPHONE UR QL SCN: NEGATIVE ng/mL
Opiate Scrn, Ur: NEGATIVE ng/mL
Ph of Urine: 7 (ref 4.5–8.9)
Phencyclidine Qn, Ur: NEGATIVE ng/mL
Propoxyphene Scrn, Ur: NEGATIVE ng/mL
SPECIFIC GRAVITY: 1.022
Tramadol Screen, Urine: NEGATIVE ng/mL

## 2024-11-28 LAB — NICOTINE SCREEN, URINE: Cotinine Ql Scrn, Ur: POSITIVE ng/mL — AB

## 2024-11-28 LAB — CANNABINOID (GC/MS), URINE: Cannabinoid: NEGATIVE

## 2024-12-03 ENCOUNTER — Encounter: Payer: Self-pay | Admitting: Licensed Practical Nurse

## 2024-12-03 ENCOUNTER — Ambulatory Visit: Payer: Self-pay | Admitting: Licensed Practical Nurse

## 2024-12-03 DIAGNOSIS — D509 Iron deficiency anemia, unspecified: Secondary | ICD-10-CM | POA: Insufficient documentation

## 2024-12-11 DIAGNOSIS — Z419 Encounter for procedure for purposes other than remedying health state, unspecified: Secondary | ICD-10-CM | POA: Diagnosis not present

## 2024-12-17 ENCOUNTER — Other Ambulatory Visit

## 2024-12-17 DIAGNOSIS — Z3689 Encounter for other specified antenatal screening: Secondary | ICD-10-CM | POA: Diagnosis not present

## 2024-12-17 DIAGNOSIS — Z3A21 21 weeks gestation of pregnancy: Secondary | ICD-10-CM | POA: Diagnosis not present

## 2024-12-17 NOTE — Addendum Note (Signed)
 Addended by: VICCI ROLLO BRAVO on: 12/17/2024 08:08 AM   Modules accepted: Orders

## 2024-12-21 ENCOUNTER — Encounter: Admitting: Obstetrics

## 2024-12-21 NOTE — Progress Notes (Deleted)
" ° ° °  Return Prenatal Note   Assessment/Plan   Plan  28 y.o. [id number] at [redacted]w[redacted]d presents for follow-up OB visit. Reviewed prenatal record including previous visit note.  No problem-specific Assessment & Plan notes found for this encounter.    No orders of the defined types were placed in this encounter.  No follow-ups on file.   Future Appointments  Date Time Provider Department Center  12/21/2024 10:15 AM Justino Eleanor HERO, CNM [facility] None  01/19/2025  9:40 AM Dineen, Janna, PA-C [facility]    For next visit:  {Return Prenatal Rjmz:[id number]}    Subjective       Objective   Flow sheet Vitals:   Total weight gain: 12 lb 14.4 oz (5.851 kg)  General Appearance  No acute distress, well appearing, and well nourished Pulmonary   Normal work of breathing Neurologic   Alert and oriented to person, place, and time Psychiatric   Mood and affect within normal limits  Eleanor Justino, CNM 12/21/2024 8:26 AM  "

## 2025-01-04 NOTE — Progress Notes (Unsigned)
" ° ° °  Return Prenatal Note   Subjective  29 y.o. H6E7997 at [redacted]w[redacted]d presents for this follow-up prenatal visit.  Patient ***  Patient reports:   Denies vaginal bleeding or leaking fluid. Objective  Flow sheet Vitals:   Total weight gain: 12 lb 14.4 oz (5.851 kg)  General Appearance  No acute distress, well appearing, and well nourished Pulmonary   Normal work of breathing Neurologic   Alert and oriented to person, place, and time Psychiatric   Mood and affect within normal limits   Assessment/Plan   Plan  29 y.o. [id number] at [redacted]w[redacted]d presents for follow-up OB visit. Reviewed prenatal record including previous visit note. There are no diagnoses linked to this encounter.  No problem-specific Assessment & Plan notes found for this encounter.    No orders of the defined types were placed in this encounter.  No follow-ups on file.   Future Appointments  Date Time Provider Department Center  01/12/2025  9:35 AM Leigh Sober, MD [facility] None  01/19/2025  9:40 AM Dineen, Janna, PA-C [facility]    For next visit:  {[email or web address]:[id number]}      Sober Leigh, DO [facility] [place]   "

## 2025-01-12 ENCOUNTER — Encounter: Admitting: Obstetrics

## 2025-01-12 DIAGNOSIS — Z113 Encounter for screening for infections with a predominantly sexual mode of transmission: Secondary | ICD-10-CM

## 2025-01-12 DIAGNOSIS — O99012 Anemia complicating pregnancy, second trimester: Secondary | ICD-10-CM

## 2025-01-12 DIAGNOSIS — Z131 Encounter for screening for diabetes mellitus: Secondary | ICD-10-CM

## 2025-01-12 DIAGNOSIS — Z3480 Encounter for supervision of other normal pregnancy, unspecified trimester: Secondary | ICD-10-CM

## 2025-01-13 ENCOUNTER — Telehealth: Payer: Self-pay

## 2025-01-13 NOTE — Telephone Encounter (Signed)
 Patient reaching out to triage, her house is full of covid, she's pretty sure she has it too, symptoms are better today but wants to know what she can take to treat her symptoms: cough, congestions. I asked her if she had NOB folder to look at OTC meds, she said yes and she will take a look at it. 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

## 2025-01-14 ENCOUNTER — Telehealth (INDEPENDENT_AMBULATORY_CARE_PROVIDER_SITE_OTHER): Admitting: Physician Assistant

## 2025-01-14 DIAGNOSIS — Z331 Pregnant state, incidental: Secondary | ICD-10-CM | POA: Diagnosis not present

## 2025-01-14 DIAGNOSIS — U071 COVID-19: Secondary | ICD-10-CM

## 2025-01-14 DIAGNOSIS — Z3490 Encounter for supervision of normal pregnancy, unspecified, unspecified trimester: Secondary | ICD-10-CM

## 2025-01-14 DIAGNOSIS — H9203 Otalgia, bilateral: Secondary | ICD-10-CM | POA: Diagnosis not present

## 2025-01-14 MED ORDER — CIPROFLOXACIN HCL 0.2 % OT SOLN: 0.2000 mL | Freq: Two times a day (BID) | OTIC | 0 refills | Status: DC

## 2025-01-14 NOTE — Progress Notes (Signed)
 " MyChart Video Visit  Virtual Visit via Video Note   This format is felt to be most appropriate for this patient at this time. Physical exam was limited by quality of the video and audio technology used for the visit.   Provider location: Virtual Visit Location Provider: Office/Clinic Patient Location: Home  I discussed the limitations of evaluation and management by telemedicine and the availability of in person appointments. The patient expressed understanding and agreed to proceed.  Patient: Vanessa Lindsey   DOB: 12/24/1996   28 y.o. Female  MRN: [id number] Visit Date: 01/14/2025  Today's healthcare provider: Jolynn Spencer, PA-C   Chief Complaint  Patient presents with   Acute Visit   Subjective     Discussed the use of AI scribe software for clinical note transcription with the patient, who gave verbal consent to proceed.  History of Present Illness Vanessa Lindsey is a 29 year old female who presents with ear pain and COVID-19 symptoms.  She tested positive for COVID-19 and has severe right-sided ear pain with milder pain in the left ear. Pain extends down her neck and was associated with ear drainage earlier this week. She also has congestion, loss of smell and taste since yesterday, facial and headache pain, chills without known fever, and residual throat swelling after prior sore throat that started over the weekend. Symptoms began with headache and sinus pressure at the same time as the ear pain. She is pregnant and has been using Robitussin DM, a humidifier, and hot showers with partial relief. She becomes short of breath easily. Everyone in her household is ill and she feels her symptoms are the worst.     Medications: Show/hide medication list[1]  Review of Systems All negative  Except see HPI       Objective    LMP 07/16/2024 (Approximate)        Physical Exam  Constitutional:      General: She is not in acute distress.    Appearance: Normal  appearance.  HENT:     Head: Normocephalic.  Pulmonary:     Effort: Pulmonary effort is normal. No respiratory distress.  Neurological:     Mental Status: She is alert and oriented to person, place, and time. Mental status is at baseline.       Assessment & Plan COVID-19 infection X 5 + days  - outside of antiviral window as symptoms present >5 days  Tested positive for COVID-19/home covid test Confirmed COVID-19 with symptoms including severe right ear pain, congestion, anosmia, ageusia, headache, sinus pressure, and pharyngodynia. Ear pain likely due to ear infection. Pregnancy limits treatment options. - Prescribed antibiotic ear drops to avoid systemic effects. - Advised warm towel for ear pain relief. - Encouraged hydration and humidifier use, nasal saline spray, OTC tylenol  for pain and fever. - Advised safe over-the-counter medications like Robitussin DM/per obgyn recommendation. - Instructed to contact if no improvement by Monday. Consider in person physical examination and monitoring.  COVID-19 (Primary) - Ciprofloxacin  HCl 0.2 % otic solution; Place 0.2 mLs into both ears 2 (two) times daily.  Dispense: 14 each; Refill: 0 Otalgia of both ears - Ciprofloxacin  HCl 0.2 % otic solution; Place 0.2 mLs into both ears 2 (two) times daily.  Dispense: 14 each; Refill: 0  Pregnancy, unspecified gestational age Due to increased risks of preeclampsia, preterm birth, advised to follow-up with obgyn Consider using non-fluorinated glucocorticoids if symptoms worsen/persist   No follow-ups on file.     I discussed  the assessment and treatment plan with the patient. The patient was provided an opportunity to ask questions and all were answered. The patient agreed with the plan and demonstrated an understanding of the instructions.   The patient was advised to call back or seek an in-person evaluation if the symptoms worsen or if the condition fails to improve as anticipated.  I,  Natesha Hassey, PA-C have reviewed all documentation for this visit. The documentation on 01/14/2025  for the exam, diagnosis, procedures, and orders are all accurate and complete.  Jolynn Spencer, [facility], MMS [facility] [phone] (phone) [phone] (fax)  [facility]      [1]  Outpatient Medications Prior to Visit  Medication Sig   aspirin  81 MG chewable tablet Chew 1 tablet (81 mg total) by mouth daily.   Ferric Maltol  (ACCRUFER ) 30 MG CAPS Take 1 capsule (30 mg total) by mouth daily.   magnesium  gluconate (MAGONATE) 500 (27 Mg) MG TABS tablet Take 300 mg by mouth once.   Prenatal Vit-Fe Phos-FA-Omega (VITAFOL  GUMMIES) 3.33-0.333-34.8 MG CHEW Chew 3 tablets by mouth daily.   No facility-administered medications prior to visit.   "

## 2025-01-18 ENCOUNTER — Other Ambulatory Visit (HOSPITAL_COMMUNITY): Payer: Self-pay

## 2025-01-18 ENCOUNTER — Telehealth: Payer: Self-pay | Admitting: Pharmacy Technician

## 2025-01-18 DIAGNOSIS — H9203 Otalgia, bilateral: Secondary | ICD-10-CM

## 2025-01-18 NOTE — Telephone Encounter (Signed)
 Pharmacy Patient Advocate Encounter   Received notification from [facility] KEY that prior authorization for Ciprofloxacin  HCl 0.2% solution is required/requested.   Insurance verification completed.   The patient is insured through [facility] MEDICAID.   Per test claim:  Below is a list of what is preferred by the insurance.  If suggested medication is appropriate, Please send in a new RX and discontinue this one. If not, please advise as to why it's not appropriate so that we may request a Prior Authorization. Please note, some preferred medications may still require a PA.  If the suggested medications have not been trialed and there are no contraindications to their use, the PA will not be submitted, as it will not be approved. Archived Key: [id number]

## 2025-01-19 ENCOUNTER — Encounter: Payer: Self-pay | Admitting: Physician Assistant

## 2025-01-19 ENCOUNTER — Ambulatory Visit (INDEPENDENT_AMBULATORY_CARE_PROVIDER_SITE_OTHER): Admitting: Physician Assistant

## 2025-01-19 VITALS — BP 95/63 | HR 90 | Resp 16 | Ht 61.0 in | Wt 208.0 lb

## 2025-01-19 DIAGNOSIS — R7303 Prediabetes: Secondary | ICD-10-CM

## 2025-01-19 DIAGNOSIS — H9203 Otalgia, bilateral: Secondary | ICD-10-CM

## 2025-01-19 DIAGNOSIS — F17200 Nicotine dependence, unspecified, uncomplicated: Secondary | ICD-10-CM

## 2025-01-19 DIAGNOSIS — G4489 Other headache syndrome: Secondary | ICD-10-CM

## 2025-01-19 DIAGNOSIS — G43809 Other migraine, not intractable, without status migrainosus: Secondary | ICD-10-CM

## 2025-01-19 DIAGNOSIS — Z0000 Encounter for general adult medical examination without abnormal findings: Secondary | ICD-10-CM | POA: Insufficient documentation

## 2025-01-19 DIAGNOSIS — K5909 Other constipation: Secondary | ICD-10-CM

## 2025-01-19 DIAGNOSIS — Z3490 Encounter for supervision of normal pregnancy, unspecified, unspecified trimester: Secondary | ICD-10-CM | POA: Insufficient documentation

## 2025-01-19 DIAGNOSIS — J302 Other seasonal allergic rhinitis: Secondary | ICD-10-CM

## 2025-01-19 DIAGNOSIS — E669 Obesity, unspecified: Secondary | ICD-10-CM

## 2025-01-19 DIAGNOSIS — Z23 Encounter for immunization: Secondary | ICD-10-CM

## 2025-01-19 DIAGNOSIS — Z0001 Encounter for general adult medical examination with abnormal findings: Secondary | ICD-10-CM | POA: Diagnosis not present

## 2025-01-19 MED ORDER — OFLOXACIN 0.3 % OT SOLN: 5.0000 [drp] | Freq: Two times a day (BID) | OTIC | 0 refills | Status: AC

## 2025-01-19 NOTE — Progress Notes (Signed)
 " Established patient visit  Patient: Vanessa Lindsey Cardosa   DOB: 05/18/1996   29 y.o. Female  MRN: [id number] Visit Date: 01/19/2025  Today's healthcare provider: Jolynn Spencer, PA-C   Chief Complaint  Patient presents with   Annual Exam    Diet: Regular Exercise: No, but keeps active at work, she is server. Feeling: Well Sleeping: fairly well. She is having trouble staying asleep. She gets 6-8 hours of sleep. No concerns  Declined flu vaccine and pneumococcal vaccine.   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 Feeling: Well Sleeping: fairly well. She is having trouble staying asleep. She gets 6-8 hours of sleep. No concerns  Declined flu vaccine and pneumococcal vaccine.      Last edited by Rosas, Joseline E, CMA on 01/19/2025  9:51 AM.       Discussed the use of AI scribe software for clinical note transcription with the patient, who gave verbal consent to proceed.  History of Present Illness Vanessa Lindsey is a 29 year old female who presents for a routine follow-up and discussion regarding her pregnancy care. She is pregnant and is followed regularly by her OBGYN, who recently reassured her that the baby is doing well. Her last TSH was normal.       01/19/2025    9:49 AM 11/24/2024   11:22 AM 11/12/2024    4:09 PM  Depression screen PHQ 2/9  Decreased Interest 1 1 0  Down, Depressed, Hopeless 1 1 1   PHQ - 2 Score 2 2 1   Altered sleeping 2 1   Tired, decreased energy 3 1   Change in appetite 1 1   Feeling bad or failure about yourself  0 2   Trouble concentrating 0 2   Moving slowly or fidgety/restless 0 1   Suicidal thoughts 0 0   PHQ-9 Score 8 10   Difficult doing work/chores Somewhat difficult Somewhat difficult       01/19/2025    9:50 AM 11/24/2024   11:24 AM 11/19/2019    2:43 PM 10/29/2019   11:14 AM  GAD 7 : Generalized Anxiety Score  Nervous, Anxious, on  Edge 0 2  0  0   Control/stop worrying 0 2  0  0   Worry too much - different things 0 2  1  1    Trouble relaxing 2 2  1  1    Restless 1 1  0  1   Easily annoyed or irritable 2 3  1  1    Afraid - awful might happen 0 1  0  0   Total GAD 7 Score 5 13 3 4   Anxiety Difficulty Somewhat difficult Somewhat difficult Not difficult at all Somewhat difficult     Data saved with a previous flowsheet row definition    Medications: Show/hide medication list[1]  Review of Systems All negative Except see HPI       Objective    BP 95/63 (BP Location: Left Arm, Patient Position: Sitting, Cuff Size: Large)   Pulse 90   Resp 16   Ht 5' 1 (1.549 m)   Wt 208 lb (94.3 kg)   LMP 07/16/2024 (Approximate)   SpO2 99%   BMI 39.30 kg/m     Physical Exam Vitals reviewed.  Constitutional:      General: She is not in acute distress.    Appearance: Normal  appearance. She is well-developed. She is not ill-appearing, toxic-appearing or diaphoretic.  HENT:     Head: Normocephalic and atraumatic.     Right Ear: Tympanic membrane, ear canal and external ear normal.     Left Ear: Tympanic membrane, ear canal and external ear normal.     Nose: Congestion and rhinorrhea present.     Mouth/Throat:     Mouth: Mucous membranes are moist.     Pharynx: Oropharynx is clear. No oropharyngeal exudate.     Comments: Postnasal drainage noted Eyes:     General: No scleral icterus.       Right eye: No discharge.        Left eye: No discharge.     Conjunctiva/sclera: Conjunctivae normal.     Pupils: Pupils are equal, round, and reactive to light.  Neck:     Thyroid : No thyromegaly.     Vascular: No carotid bruit.  Cardiovascular:     Rate and Rhythm: Normal rate and regular rhythm.     Pulses: Normal pulses.     Heart sounds: Normal heart sounds. No murmur heard.    No friction rub. No gallop.  Pulmonary:     Effort: Pulmonary effort is normal. No respiratory distress.     Breath sounds: Normal breath  sounds. No wheezing, rhonchi or rales.  Abdominal:     General: Abdomen is flat. Bowel sounds are normal. There is no distension.     Palpations: Abdomen is soft. There is no mass.     Tenderness: There is no abdominal tenderness. There is no right CVA tenderness, left CVA tenderness, guarding or rebound.     Hernia: No hernia is present.  Musculoskeletal:        General: No swelling, tenderness, deformity or signs of injury. Normal range of motion.     Cervical back: Normal range of motion and neck supple. No rigidity or tenderness.     Right lower leg: No edema.     Left lower leg: No edema.  Lymphadenopathy:     Cervical: No cervical adenopathy.  Skin:    General: Skin is warm and dry.     Coloration: Skin is not jaundiced or pale.     Findings: No bruising, erythema, lesion or rash.  Neurological:     Mental Status: She is alert and oriented to person, place, and time. Mental status is at baseline.     Gait: Gait normal.  Psychiatric:        Mood and Affect: Mood normal.        Behavior: Behavior normal.        Thought Content: Thought content normal.        Judgment: Judgment normal.      No results found for any visits on 01/19/25.      Assessment & Plan Supervision of normal pregnancy/26 weeks TSH normal. Electrolyte levels fluctuating but stable. - Continue routine prenatal care with OBGYN. - Ensure OBGYN monitors TSH and electrolyte levels/CMP due to prediabetes. - Schedule follow-up in one year for post-pregnancy physical examination.  URI/nasal congestion/hx of seasonal allergies/Otalgia Normal vitals and normal lung pe Continue symptomatic treatment RTC if symptoms persist  Vaping nicotine  dependence, non-tobacco product Strongly encouraged cessation Will follow-up  Annual physical exam Well adult visit with abnormal findings Things to do to keep yourself healthy  - Exercise at least 30-45 minutes a day, 3-4 days a week.  - Eat a low-fat diet with lots  of fruits and vegetables, up  to 7-9 servings per day.  - Seatbelts can save your life. Wear them always.  - Smoke detectors on every level of your home, check batteries every year.  - Eye Doctor - have an eye exam every 1-2 years  - Safe sex - if you may be exposed to STDs, use a condom.  - Alcohol -  If you drink, do it moderately, less than 2 drinks per day.  - Health Care Power of Attorney. Choose someone to speak for you if you are not able.  - Depression is common in our stressful world.If you're feeling down or losing interest in things you normally enjoy, please come in for a visit.  - Violence - If anyone is threatening or hurting you, please call immediately.  Prediabetes Advised to recheck with the labs ordered by OBGYN Will follow-up   No orders of the defined types were placed in this encounter.   No follow-ups on file.   The patient was advised to call back or seek an in-person evaluation if the symptoms worsen or if the condition fails to improve as anticipated.  I discussed the assessment and treatment plan with the patient. The patient was provided an opportunity to ask questions and all were answered. The patient agreed with the plan and demonstrated an understanding of the instructions.  I, Kalyn Hofstra, PA-C have reviewed all documentation for this visit. The documentation on 01/19/2025  for the exam, diagnosis, procedures, and orders are all accurate and complete.  Jolynn Spencer, [facility], MMS [facility] [phone] (phone) [phone] (fax)   Medical Group     [1]  Outpatient Medications Prior to Visit  Medication Sig   aspirin  81 MG chewable tablet Chew 1 tablet (81 mg total) by mouth daily.   Ferric Maltol  (ACCRUFER ) 30 MG CAPS Take 1 capsule (30 mg total) by mouth daily.   Prenatal Vit-Fe Phos-FA-Omega (VITAFOL  GUMMIES) 3.33-0.333-34.8 MG CHEW Chew 3 tablets by mouth daily.   Ciprofloxacin  HCl 0.2 % otic solution Place 0.2 mLs  into both ears 2 (two) times daily. (Patient not taking: Reported on 01/19/2025)   magnesium  gluconate (MAGONATE) 500 (27 Mg) MG TABS tablet Take 300 mg by mouth once.   No facility-administered medications prior to visit.   "

## 2025-01-19 NOTE — Addendum Note (Signed)
 Addended by: Shaylynne Lunt on: 01/19/2025 12:23 PM   Modules accepted: Orders

## 2026-01-21 ENCOUNTER — Encounter: Admitting: Physician Assistant
